# Patient Record
Sex: Female | Born: 1975 | Race: Black or African American | Hispanic: No | Marital: Single | Smoking: Current every day smoker
Health system: Southern US, Community
[De-identification: ages and names within clinical notes are randomized; demographics above are authoritative.]

## PROBLEM LIST (undated history)

## (undated) DIAGNOSIS — I10 Essential (primary) hypertension: Secondary | ICD-10-CM

---

## 2015-03-08 ENCOUNTER — Emergency Department (HOSPITAL_COMMUNITY): Payer: Self-pay

## 2015-03-08 ENCOUNTER — Encounter (HOSPITAL_COMMUNITY): Payer: Self-pay

## 2015-03-08 ENCOUNTER — Emergency Department (HOSPITAL_COMMUNITY)
Admission: EM | Admit: 2015-03-08 | Discharge: 2015-03-08 | Disposition: A | Discharge status: Home / Self Care | Payer: Self-pay | Attending: Emergency Medicine | Admitting: Emergency Medicine

## 2015-03-08 DIAGNOSIS — H0015 Chalazion left lower eyelid: Secondary | ICD-10-CM | POA: Insufficient documentation

## 2015-03-08 DIAGNOSIS — H0016 Chalazion left eye, unspecified eyelid: Secondary | ICD-10-CM

## 2015-03-08 DIAGNOSIS — Z3202 Encounter for pregnancy test, result negative: Secondary | ICD-10-CM | POA: Insufficient documentation

## 2015-03-08 DIAGNOSIS — Z720 Tobacco use: Secondary | ICD-10-CM | POA: Insufficient documentation

## 2015-03-08 LAB — I-STAT CHEM 8, ED
BUN: 10 mg/dL (ref 6–23)
Calcium, Ion: 1.2 mmol/L (ref 1.12–1.23)
Chloride: 102 mmol/L (ref 96–112)
Creatinine, Ser: 0.7 mg/dL (ref 0.50–1.10)
Glucose, Bld: 89 mg/dL (ref 70–99)
HEMATOCRIT: 44 % (ref 36.0–46.0)
Hemoglobin: 15 g/dL (ref 12.0–15.0)
POTASSIUM: 4.1 mmol/L (ref 3.5–5.1)
SODIUM: 138 mmol/L (ref 135–145)
TCO2: 22 mmol/L (ref 0–100)

## 2015-03-08 LAB — POC URINE PREG, ED: PREG TEST UR: NEGATIVE

## 2015-03-08 MED ORDER — HYDROCODONE-ACETAMINOPHEN 5-325 MG PO TABS
2.0000 | ORAL_TABLET | Freq: Once | ORAL | Status: AC
  Administered 2015-03-08: 2 via ORAL
  Filled 2015-03-08: qty 2

## 2015-03-08 MED ORDER — HYDROCODONE-ACETAMINOPHEN 5-325 MG PO TABS: 1.0000 | ORAL_TABLET | Freq: Four times a day (QID) | ORAL | Status: DC | PRN

## 2015-03-08 MED ORDER — CEPHALEXIN 500 MG PO CAPS: 500.0000 mg | ORAL_CAPSULE | Freq: Four times a day (QID) | ORAL | Status: DC

## 2015-03-08 MED ORDER — LIDOCAINE HCL (PF) 1 % IJ SOLN
5.0000 mL | Freq: Once | INTRAMUSCULAR | Status: AC
  Administered 2015-03-08: 5 mL via INTRADERMAL
  Filled 2015-03-08: qty 5

## 2015-03-08 MED ORDER — IOHEXOL 300 MG/ML  SOLN
100.0000 mL | Freq: Once | INTRAMUSCULAR | Status: AC | PRN
  Administered 2015-03-08: 100 mL via INTRAVENOUS

## 2015-03-08 NOTE — ED Notes (Signed)
The tech has applied an clean dressing to the left lower eye lid. The tech has reported to the RN in charge.

## 2015-03-08 NOTE — Discharge Instructions (Signed)
Continue soaking your eyelid with warm water.  Use medications as prescribed.  Follow up with Dr. Cathey EndowBowen on Monday morning.  You should call his office first thing Monday.  Please return to the ER with any worsening of your vision, severe pain, high fever greater than 100.5 F.    Chalazion A chalazion is a swelling or hard lump on the eyelid caused by a blocked oil gland. Chalazions may occur on the upper or the lower eyelid.  CAUSES  Oil gland in the eyelid becomes blocked. SYMPTOMS   Swelling or hard lump on the eyelid. This lump may make it hard to see out of the eye.  The swelling may spread to areas around the eye. TREATMENT   Although some chalazions disappear by themselves in 1 or 2 months, some chalazions may need to be removed.  Medicines to treat an infection may be required. HOME CARE INSTRUCTIONS   Wash your hands often and dry them with a clean towel. Do not touch the chalazion.  Apply heat to the eyelid several times a day for 10 minutes to help ease discomfort and bring any yellowish white fluid (pus) to the surface. One way to apply heat to a chalazion is to use the handle of a metal spoon.  Hold the handle under hot water until it is hot, and then wrap the handle in paper towels so that the heat can come through without burning your skin.  Hold the wrapped handle against the chalazion and reheat the spoon handle as needed.  Apply heat in this fashion for 10 minutes, 4 times per day.  Return to your caregiver to have the pus removed if it does not break (rupture) on its own.  Do not try to remove the pus yourself by squeezing the chalazion or sticking it with a pin or needle.  Only take over-the-counter or prescription medicines for pain, discomfort, or fever as directed by your caregiver. SEEK IMMEDIATE MEDICAL CARE IF:   You have pain in your eye.  Your vision changes.  The chalazion does not go away.  The chalazion becomes painful, red, or swollen, grows  larger, or does not start to disappear after 2 weeks. MAKE SURE YOU:   Understand these instructions.  Will watch your condition.  Will get help right away if you are not doing well or get worse. Document Released: 10/29/2000 Document Revised: 01/24/2012 Document Reviewed: 02/16/2010 [facility] Patient Information 2015 [place], [place]. This information is not intended to replace advice given to you by your health care provider. Make sure you discuss any questions you have with your health care provider.   Emergency Department Resource Guide 1) Find a Doctor and Pay Out of Pocket Although you won't have to find out who is covered by your insurance plan, it is a good idea to ask around and get recommendations. You will then need to call the office and see if the doctor you have chosen will accept you as a new patient and what types of options they offer for patients who are self-pay. Some doctors offer discounts or will set up payment plans for their patients who do not have insurance, but you will need to ask so you aren't surprised when you get to your appointment.  2) Contact Your Local Health Department Not all health departments have doctors that can see patients for sick visits, but many do, so it is worth a call to see if yours does. If you don't know where your local health  department is, you can check in your phone book. The CDC also has a tool to help you locate your state's health department, and many state websites also have listings of all of their local health departments.  3) Find a Walk-in Clinic If your illness is not likely to be very severe or complicated, you may want to try a walk in clinic. These are popping up all over the country in pharmacies, drugstores, and shopping centers. They're usually staffed by nurse practitioners or physician assistants that have been trained to treat common illnesses and complaints. They're usually fairly quick and inexpensive. However, if you have  serious medical issues or chronic medical problems, these are probably not your best option.  No Primary Care Doctor: - Call Health Connect at  [phone] - they can help you locate a primary care doctor that  accepts your insurance, provides certain services, etc. - Physician Referral Service- [phone]  Chronic Pain Problems: Organization         Address  Phone   Notes  [facility]  [phone] Patients need to be referred by their primary care doctor.   Medication Assistance: Organization         Address  Phone   Notes  [facility] Medication [facility] [address] [place] [place], [place] [address] [phone] --Must be a resident of [facility] -- Must have NO insurance coverage whatsoever (no Medicaid/ Medicare, etc.) -- The pt. MUST have a primary care doctor that directs their care regularly and follows them in the community   Med[facility]ist  [phone]   [employer]  [phone]    Agencies that provide inexpensive medical care: Organization         Address  Phone   Notes  [facility]  [phone]   [facility]    [phone]   [facility] [address] [place], [place] [address] [phone]   [facility] [address] [place], [place] [phone]   Planned Parenthood    [phone]   [facility]    [phone]   [facility]  [address], [place] Phone:  [phone], Fax:  [phone] Hours of Operation:  9 am - 6 pm, M-F.  Also accepts Medicaid/Medicare and self-pay.  [facility]  [address], [place] Phone: [phone], Fax: [phone]. Hours of Operation:  8:30 am - 5:30 pm, M-F.  Also accepts Medicaid and self-pay.  [facility] [place] [address], [place] Phone: [phone]   [facility] [address] Natasha Bence [place], [place] [phone], Ext. [phone] Mondays & Thursdays: 7-9 AM.  First 15 patients are seen on a first come, first serve basis.    Medicaid-accepting [facility] Providers:  Organization         Address  Phone   Notes  [facility] [address], [place] [phone] Also accepts self-pay patients.  [facility] [address] Laurell Josephs [place], [place]  [phone]   [facility] [address], [place] [phone]   [facility] [address] [place] [phone]   Renaye Rakers [address], [place]   [phone] Only accepts [place] Access [place] patients after they have  their name applied to their card.   Self-Pay (no insurance) in [facility]:  Organization         Address  Phone   Notes  Sickle Cell Patients, [facility] [address] [place], [place] [phone]   [facility] [address] [place], [place] [phone]   [facility] [place]  [address], [place] [phone]   [facility]/Dr. Osei-Bonsu  [address], [place] or [address], [place] [phone] Phone number for both [place] and [place] locations is the same.  [facility] [address] [place] [phone]   [facility] [address], [place] or [address] [place] [address] [phone]   [facility] [address], [place] [phone], phone; [phone], fax Sees patients 1st and 3rd Saturday of every month.  Must not qualify for public or private insurance (i.e. Medicaid, Medicare, [place] Health Choice, Veterans' Benefits)  Household income should be no more than 200% of the poverty level The clinic cannot treat you if you are pregnant or think you are pregnant   Sexually transmitted diseases are not treated at the clinic.    Dental Care: Organization         Address  Phone  Notes  [facility] Department of [facility] [address] [place], [place] [phone] Accepts children up to age 65 who are enrolled in [place] or [facility] Health Choice; pregnant women with a Medicaid card; and children who have applied for Medicaid or [place] Health Choice, but were declined, whose parents can pay a reduced fee at time of service.  [facility] of [facility]  [address], [place] [phone] Accepts children up to age 74 who are enrolled in [place] or [place] Health Choice; pregnant women with a Medicaid card; and children who have applied for Medicaid or [place] Health Choice, but were declined, whose parents can pay a reduced fee at time of service.  [facility]  [address] [place], [place] [phone] Patients are seen by appointment only. Walk-ins are not accepted. [facility] will see patients 12 years of age and older. Monday - Tuesday (8am-5pm) Most Wednesdays (8:30-5pm) $30 per visit, cash only  [facility] Adult Dental Access [facility]  [address], [facility] [phone] Patients are seen by appointment only. Walk-ins are not accepted. [facility] will see patients 74 years of age and older. One Wednesday Evening (Monthly: Volunteer Based).  $30 per visit, cash only  [employer]  [phone] for adults; Children under age 23, call Graduate Pediatric Dentistry at [phone]. Children aged 26-14, please call [phone] to request a pediatric application.  Dental services are provided in all areas of dental care including fillings, crowns and bridges, complete and partial dentures, implants, gum treatment, root canals, and extractions. Preventive care is also provided. Treatment is provided to both adults and children. Patients  are selected via a lottery and there is often a waiting list.   [facility] [address] [place]  [phone] [email or web address]   [facility] [address] [place], [place] [phone], Ext. [phone] Second and Fourth Thursday of each month, opens at 6:30 AM; Clinic ends at 9 AM.  Patients are seen on  a [employer]	[place] basis, and a limited number are seen during each clinic.   [facility]  [address] Ether Griffins [place], [place] [phone]   Eligibility Requirements You must have lived in [place], [place], or [place] counties for at least the last three months.   You cannot be eligible for state or federal sponsored [place], including [employer], [place], or [employer].   You generally cannot be eligible for healthcare insurance through your employer.    How to apply: Eligibility screenings are held every Tuesday and Wednesday afternoon from 1:00 pm until 4:00 pm. You do not need an appointment for the interview!  [facility] [address], [place], [place] [phone]   [facility] Health Department  [phone]   [facility] Health Department  [phone]   [facility]  [phone]    Behavioral Health Resources in the Community: Intensive Outpatient Programs Organization         Address  Phone  Notes  [facility] [address] [place], [place] [phone]   [facility] [address], [place], [place] [phone]   [facility] [address] [place], [place]  [phone]   [facility] [address],  [place], [place] [phone] or [phone]   Substance Abuse Resources Organization         Address  Phone  Notes  [facility]  [phone]   [facility]  [phone]   [facility] [place]  [phone]   Floydene Flock  [phone]    Residential & Outpatient Substance Abuse Program  [phone]   Psychological Services Organization         Address  Phone  Notes  [facility] Behavioral Health  [phone]   [facility]  [phone]   [facility] [address], [place] [phone] or [phone]    Mobile Crisis Teams Organization         Address  Phone  Notes  [facility], Mobile Crisis Care Unit  [phone]   [facility]  [address] [place], [place] [phone]   Doristine Locks [address] [place] [phone]    Self-Help/Support Groups Organization         Address  Phone             Notes  [facility] [place] - variety of support groups  [phone]- [phone] Call for more information  Narcotics Anonymous (NA), [facility] [address], [employer] [place]  2 meetings at this location   Statistician         Address  Phone  Notes  [facility] [address],    [place]  [phone]   [facility]  [address] [place] [address], [place], [place] [phone]   [facility] [address] [place], [place] [phone] Admissions: 8am-3pm M-F  [facility] [address],    [place], [place] [phone]   [facility] [address] Starling Manns [place], [place] [phone]   [facility] [address]  [place], [place] [phone]   [facility] - Intensive Outpatient [address] Laurell Josephs [address], [place], [place] [phone]   [facility] ([facility] [address] [place]  [place], [place] [phone] or [phone]   [facility] ([facility]) [address], [place], [place] [phone]  Accepts Medicaid  [facility] [place] [address],  [place] [phone] Substance Abuse/Addiction Treatment   [facility] Organization         Address  Phone  Notes  [facility]  [phone]   Angie Fava, PhD [address] Ervin Knack [place], [place]   [phone] or [phone]   [facility] Behavioral   [address] [place], [place] [phone]   [facility] [address], [place], [place] [phone] Insurance/Medicaid/sponsorship through [facility] and Families [address]                                    [place], [place] [phone] Therapy/tele-psych/case  [facility] [address], [place] [phone]    Dr. Lolly Mustache  [phone]   [facility] [place]  [employer] [facility] Dept. 1) [address] [place] 2) [address], [place] 3)  [address], [place] [phone]  [phone]   [facility] Child Abuse Hotline [phone] or [phone] (After Hours)

## 2015-03-08 NOTE — ED Provider Notes (Signed)
Care assumed from Ladona MowJoe Mintz PA-C at shift change. Pt awaiting CT of orbits to eval for extension of infected chalazion.    Physical Exam  BP 134/77 mmHg  Pulse 65  Temp(Src) 98.2 F (36.8 C) (Oral)  Resp 16  Ht 5\' 3"  (1.6 m)  Wt 167 lb 1 oz (75.779 kg)  BMI 29.60 kg/m2  SpO2 100%  LMP 01/24/2015  Physical Exam HEENT: L lower lid with chalazion, erythematous and swollen, tender, EOMI without pain  ED Course  Procedures Results for orders placed or performed during the hospital encounter of 03/08/15  POC Urine Pregnancy, ED (do NOT [facility] at [facility])  Result Value Ref Range   Preg Test, Ur NEGATIVE NEGATIVE  I-stat chem 8, ed  Result Value Ref Range   Sodium 138 135 - 145 mmol/L   Potassium 4.1 3.5 - 5.1 mmol/L   Chloride 102 96 - 112 mmol/L   BUN 10 6 - 23 mg/dL   Creatinine, Ser 1.470.70 0.50 - 1.10 mg/dL   Glucose, Bld 89 70 - 99 mg/dL   Calcium, Ion 8.291.20 1.12 - 1.23 mmol/L   TCO2 22 0 - 100 mmol/L   Hemoglobin 15.0 12.0 - 15.0 g/dL   HCT 56.244.0 13.036.0 - 86.546.0 %   Ct Orbits W/cm  03/08/2015   CLINICAL DATA:  Periorbital lesion.  Infected wound.  Swelling.  EXAM: CT ORBITS WITH CONTRAST  TECHNIQUE: Multidetector CT imaging of the orbits was performed following the bolus administration of intravenous contrast.  CONTRAST:  100mL OMNIPAQUE IOHEXOL 300 MG/ML  SOLN  COMPARISON:  None.  FINDINGS: Infraorbital phlegmon is present on the LEFT, with enhancement of the soft tissues of the superior LEFT cheek. There is a subcutaneous abscess with peripheral enhancement measuring 15 mm x 9 mm on axial imaging. 9 mm is the AP dimension. Craniocaudal dimension is 11.4 mm on sagittal imaging.  There is no orbital abscess or orbital phlegmon. The RIGHT globe and orbit appear normal. Intracranial contents are within normal limits.  Paranasal sinuses appear within normal limits.  IMPRESSION: LEFT infraorbital subcutaneous abscess.   Electronically Signed   By: Andreas NewportGeoffrey  Lamke M.D.   On: 03/08/2015 18:29      Meds ordered this encounter  Medications  . lidocaine (PF) (XYLOCAINE) 1 % injection 5 mL    Sig:   . HYDROcodone-acetaminophen (NORCO/VICODIN) 5-325 MG per tablet 2 tablet    Sig:   . ibuprofen (ADVIL,MOTRIN) 200 MG tablet    Sig: Take 200 mg by mouth every 6 (six) hours as needed for mild pain or moderate pain.  Marland Kitchen. OVER THE COUNTER MEDICATION    Sig: Place 1 drop into both eyes daily as needed (eye irritattion).  . iohexol (OMNIPAQUE) 300 MG/ML solution 100 mL    Sig:   . HYDROcodone-acetaminophen (NORCO/VICODIN) 5-325 MG per tablet    Sig: Take 1-2 tablets by mouth every 6 (six) hours as needed.    Dispense:  10 tablet    Refill:  0    Order Specific Question:  Supervising Provider    Answer:  Hyacinth MeekerMILLER, BRIAN [[id number]]  . cephALEXin (KEFLEX) 500 MG capsule    Sig: Take 1 capsule (500 mg total) by mouth 4 (four) times daily.    Dispense:  40 capsule    Refill:  0    Order Specific Question:  Supervising Provider    Answer:  Eber HongMILLER, BRIAN [[id number]]     MDM:   ICD-9-CM ICD-10-CM   1. Chalazion of left eye 373.2  H00.16     6:44 PM CT neg for orbital extension. Pt given dispo instructions per Ladona Mow PA-C   Chee Dimon Camprubi-Soms, PA-C 03/08/15 1844  Gilda Crease, MD 03/09/15 [phone]

## 2015-03-08 NOTE — ED Provider Notes (Signed)
Patient presented to the ER with swelling of left eyelid. Initially started as a small area that she thought was a stye, now the entire left eyelid is swollen and painful  Face to face Exam: HEENT - PERRLA; significant swelling, tenderness and fluctuance of left lower eyelid with some purulent drainage spontaneously draining Lungs - CTAB Heart - RRR, no M/R/G Abd - S/NT/ND Neuro - alert, oriented x3  Plan: Patient with eyelid inflammation and infection. Is already is spontaneously draining, will slightly open the area further and cover with antibiotics, follow-up with ophthalmologist  Gilda Creasehristopher J Keo Schirmer, MD 03/08/15 1430

## 2015-03-08 NOTE — ED Notes (Signed)
Pt had what she thought was a sty since this past Tuesday and since it has gotten bigger and worse.

## 2015-03-08 NOTE — ED Provider Notes (Signed)
CSN: [id number]     Arrival date & time 03/08/15  1316 History   This chart was scribed for non-physician practitioner, Jinny Sanders, PA-C working with Gilda Crease, MD, by Abel Presto, ED Scribe. This patient was seen in room TR04C/TR04C and the patient's care was started at 1:44 PM.       Chief Complaint  Patient presents with  . Eye Problem    Patient is a 39 y.o. female presenting with eye problem. The history is provided by the patient. No language interpreter was used.  Eye Problem  HPI Comments: Mary Oneill is a 39 y.o. female who presents to the Emergency Department complaining of lesion to lower left eyelid with onset 4 days ago. Pt states lesion was similar to a stye at onset but she reports she has been "picking" at the area which has become larger with associated swelling since onset. Noted scabbing and mild bleeding to the area. Pt with h/o of styes. Pt denies fever, eye pain, and visual disturbances.    History reviewed. No pertinent past medical history. History reviewed. No pertinent past surgical history. No family history on file. History  Substance Use Topics  . Smoking status: Current Every Day Smoker  . Smokeless tobacco: Not on file  . Alcohol Use: Yes     Comment: socially   OB History    No data available     Review of Systems  Constitutional: Negative for fever.  Eyes: Negative for pain.       Lesion to lower left eyelid      Allergies  Review of patient's allergies indicates no known allergies.  Home Medications   Prior to Admission medications   Medication Sig Start Date End Date Taking? Authorizing Provider  ibuprofen (ADVIL,MOTRIN) 200 MG tablet Take 200 mg by mouth every 6 (six) hours as needed for mild pain or moderate pain.   Yes Historical Provider, MD  OVER THE COUNTER MEDICATION Place 1 drop into both eyes daily as needed (eye irritattion).   Yes Historical Provider, MD  cephALEXin (KEFLEX) 500 MG capsule Take 1 capsule (500  mg total) by mouth 4 (four) times daily. 03/08/15   Ladona Mow, PA-C  HYDROcodone-acetaminophen (NORCO/VICODIN) 5-325 MG per tablet Take 1-2 tablets by mouth every 6 (six) hours as needed. 03/08/15   Ladona Mow, PA-C   BP 125/73 mmHg  Pulse 66  Temp(Src) 98.1 F (36.7 C) (Oral)  Resp 16  Ht  (1.6 m)  Wt 167 lb 1 oz (75.779 kg)  BMI 29.60 kg/m2  SpO2 100%  LMP 01/24/2015 Physical Exam  Constitutional: She is oriented to person, place, and time. She appears well-developed and well-nourished.  HENT:  Head: Normocephalic.  1x 2 cm cystic lesion on lower left eyelid in the medial epicanthal fold  Eyes: Conjunctivae are normal.  Neck: Normal range of motion. Neck supple.  Pulmonary/Chest: Effort normal.  Musculoskeletal: Normal range of motion.  Neurological: She is alert and oriented to person, place, and time.  Skin: Skin is warm and dry.  Psychiatric: She has a normal mood and affect. Her behavior is normal.  Nursing note and vitals reviewed.   ED Course  Procedures (including critical care time) DIAGNOSTIC STUDIES: Oxygen Saturation is 100% on room air, normal by my interpretation.    COORDINATION OF CARE: 1:50 PM Discussed treatment plan with patient at beside, the patient agrees with the plan and has no further questions at this time.   Labs Review Labs Reviewed  POC URINE PREG, ED  I-STAT CHEM 8, ED    Imaging Review Ct Orbits W/cm  03/08/2015   CLINICAL DATA:  Periorbital lesion.  Infected wound.  Swelling.  EXAM: CT ORBITS WITH CONTRAST  TECHNIQUE: Multidetector CT imaging of the orbits was performed following the bolus administration of intravenous contrast.  CONTRAST:  100mL OMNIPAQUE IOHEXOL 300 MG/ML  SOLN  COMPARISON:  None.  FINDINGS: Infraorbital phlegmon is present on the LEFT, with enhancement of the soft tissues of the superior LEFT cheek. There is a subcutaneous abscess with peripheral enhancement measuring 15 mm x 9 mm on axial imaging. 9 mm is the AP  dimension. Craniocaudal dimension is 11.4 mm on sagittal imaging.  There is no orbital abscess or orbital phlegmon. The RIGHT globe and orbit appear normal. Intracranial contents are within normal limits.  Paranasal sinuses appear within normal limits.  IMPRESSION: LEFT infraorbital subcutaneous abscess.   Electronically Signed   By: Andreas NewportGeoffrey  Lamke M.D.   On: 03/08/2015 18:29     EKG Interpretation None     INCISION AND DRAINAGE PROCEDURE NOTE: Patient identification was confirmed and verbal consent was obtained. This procedure was performed by Jinny SandersJoseph Pape Parson, PA-C at 2:30 PM. Site: left lower eyelid Sterile procedures observed Needle size: 25 guage Anesthetic used (type and amt): lido 1% Blade size: 11 blade Drainage: scant purulent drainage Complexity: Complex Packing used: none Site anesthetized, incision made over site, wound drained and explored loculations, rinsed with copious amounts of normal saline, covered with dry, sterile dressing.  Pt tolerated procedure well without complications.  Instructions for care discussed verbally and pt provided with additional written instructions for homecare and f/u.  MDM   Final diagnoses:  Chalazion of left eye   Patient here with signs and symptoms consistent with a large Chalazion in her lower left eyelid. This cystic lesion appeared infected, had some purulent discharge from it, incision and drainage was warranted as noted in procedure note above. Visual acuity screening shows change in visual acuity, however patient states this is due to the fact that her eyelid is so swollen, she denies any actual visual acuity change from her baseline. I spoke with Dr. Doylene CanningBolin regarding patient's case, Dr. Orson SlickBowman recommended follow-up with CT scan of orbits with contrast, continue warm compresses of eye, Keflex 10 days and follow-up with him in his office on Monday. We'll place order for CT scan. [facility] with plan to  f/u on CT results and discharge if there is no orbital involvement.  Plan to call Dr. Cathey EndowBowen back if involvement is more than superficial.  Pt is to be discharged to f/u with Dr. Cathey EndowBowen on Monday. Encouraged warm soaks and return precautions with pt.   I personally performed the services described in this documentation, which was scribed in my presence. The recorded information has been reviewed and is accurate.   BP 125/73 mmHg  Pulse 66  Temp(Src) 98.1 F (36.7 C) (Oral)  Resp 16  Ht 5\' 3"  (1.6 m)  Wt 167 lb 1 oz (75.779 kg)  BMI 29.60 kg/m2  SpO2 100%  LMP 01/24/2015  Signed,  Ladona MowJoe Oneill Bais, PA-C 7:08 AM     Ladona MowJoe Lindzie Boxx, PA-C 03/09/15 [phone]  Gilda Creasehristopher J Pollina, MD 03/09/15 [phone]

## 2015-03-08 NOTE — ED Notes (Signed)
Spoke with CT states she is the next one on  The list.

## 2015-08-17 ENCOUNTER — Emergency Department (INDEPENDENT_AMBULATORY_CARE_PROVIDER_SITE_OTHER)
Admission: EM | Admit: 2015-08-17 | Discharge: 2015-08-17 | Disposition: A | Discharge status: Home / Self Care | Payer: Self-pay | Source: Home / Self Care | Attending: Emergency Medicine | Admitting: Emergency Medicine

## 2015-08-17 ENCOUNTER — Encounter (HOSPITAL_COMMUNITY): Payer: Self-pay | Admitting: Emergency Medicine

## 2015-08-17 ENCOUNTER — Emergency Department (INDEPENDENT_AMBULATORY_CARE_PROVIDER_SITE_OTHER): Payer: Self-pay

## 2015-08-17 DIAGNOSIS — S93401A Sprain of unspecified ligament of right ankle, initial encounter: Secondary | ICD-10-CM

## 2015-08-17 MED ORDER — TRAMADOL HCL 50 MG PO TABS: 50.0000 mg | ORAL_TABLET | Freq: Four times a day (QID) | ORAL | Status: DC | PRN

## 2015-08-17 MED ORDER — IBUPROFEN 800 MG PO TABS: 800.0000 mg | ORAL_TABLET | Freq: Three times a day (TID) | ORAL | Status: DC | PRN

## 2015-08-17 NOTE — Discharge Instructions (Signed)
You have a bad ankle sprain. Wear the brace for the next 1-2 weeks. Use the crutches for the next few days. Starting on Tuesday start to slowly add more and more weight to your ankle. You should be able to get rid of the crutches in 5-7 days. Keep her foot elevated as much as possible. Apply ice as often as possible. Take ibuprofen  3 times a day for the next 3 days, then as needed. Use tramadol every 6-8 hours as needed for severe pain. Do not drive while taking this medicine. It will take 2-3 weeks for your ankle to completely heal.

## 2015-08-17 NOTE — ED Provider Notes (Signed)
CSN: [id number]     Arrival date & time 08/17/15  1339 History   First MD Initiated Contact with Patient 08/17/15 1441     Chief Complaint  Patient presents with  . Foot Injury   (Consider location/radiation/quality/duration/timing/severity/associated sxs/prior Treatment) HPI She is a 39 year old woman here for evaluation of right foot injury. She states she tripped over a stump last night. She does not remember exactly how she hit her foot. She reports pain and swelling primarily on the medial aspect of the ankle. She cannot bear weight. She has not tried any medications.  History reviewed. No pertinent past medical history. History reviewed. No pertinent past surgical history. No family history on file. Social History  Substance Use Topics  . Smoking status: Current Every Day Smoker  . Smokeless tobacco: None  . Alcohol Use: Yes     Comment: socially   OB History    No data available     Review of Systems As in history of present illness Allergies  Review of patient's allergies indicates no known allergies.  Home Medications   Prior to Admission medications   Medication Sig Start Date End Date Taking? Authorizing Provider  HYDROcodone-acetaminophen (NORCO/VICODIN) 5-325 MG per tablet Take 1-2 tablets by mouth every 6 (six) hours as needed. 03/08/15   Ladona Mow, PA-C  ibuprofen (ADVIL,MOTRIN) 800 MG tablet Take 1 tablet (800 mg total) by mouth every 8 (eight) hours as needed for moderate pain. 08/17/15   Charm Rings, MD  OVER THE COUNTER MEDICATION Place 1 drop into both eyes daily as needed (eye irritattion).    Historical Provider, MD  traMADol (ULTRAM) 50 MG tablet Take 1 tablet (50 mg total) by mouth every 6 (six) hours as needed. 08/17/15   Charm Rings, MD   Meds Ordered and Administered this Visit  Medications - No data to display  BP 161/88 mmHg  Pulse 83  Temp(Src) 98.4 F (36.9 C) (Oral)  Resp 18  SpO2 100% No data found.   Physical Exam  Constitutional:  She is oriented to person, place, and time. She appears well-developed and well-nourished. No distress.  Cardiovascular: Normal rate.   Pulmonary/Chest: Effort normal.  Musculoskeletal:  Right ankle: 2+ DP pulse. She has swelling anterior to the medial malleolus. She is tender over the deltoid ligament. Mild tenderness to posterior medial malleolus. No fifth metatarsal tenderness. No joint laxity.  Neurological: She is alert and oriented to person, place, and time.    ED Course  Procedures (including critical care time)  Labs Review Labs Reviewed - No data to display  Imaging Review Dg Ankle Complete Right  08/17/2015   CLINICAL DATA:  Pain following fall one day prior  EXAM: RIGHT ANKLE - COMPLETE 3+ VIEW  COMPARISON:  None.  FINDINGS: Frontal, oblique, and lateral views were obtained. There is no demonstrable fracture or effusion. The ankle mortise appears intact. No appreciable joint space narrowing.  IMPRESSION: No fracture or arthropathy.  Ankle mortise appears intact.   Electronically Signed   By: Bretta Bang III M.D.   On: 08/17/2015 15:08   Dg Foot Complete Right  08/17/2015   CLINICAL DATA:  initial encounter fall yesterday medial right ankle and lateral right foot pain  EXAM: RIGHT FOOT COMPLETE - 3+ VIEW  COMPARISON:  None.  FINDINGS: Mild arthritis of the first metatarsophalangeal joint. No fracture or dislocation.  IMPRESSION: No acute findings   Electronically Signed   By: Esperanza Heir M.D.   On: 08/17/2015 15:09  MDM   1. Right ankle sprain, initial encounter    X-rays negative. Treat with rest, ice, bracing, ibuprofen. Crutches given.  ASO given. Tramadol for severe pain. Follow-up as needed.    Charm Rings, MD 08/17/15 [phone]

## 2015-08-17 NOTE — ED Notes (Signed)
C/o right foot inj onset last; reports she hit it against tree stump  Brought back in wheel chair A&O x4... No acute distress.

## 2016-03-24 ENCOUNTER — Encounter (HOSPITAL_COMMUNITY): Payer: Self-pay | Admitting: *Deleted

## 2016-03-24 ENCOUNTER — Emergency Department (HOSPITAL_COMMUNITY)
Admission: EM | Admit: 2016-03-24 | Discharge: 2016-03-24 | Disposition: A | Discharge status: Home / Self Care | Payer: Self-pay | Attending: Emergency Medicine | Admitting: Emergency Medicine

## 2016-03-24 DIAGNOSIS — I10 Essential (primary) hypertension: Secondary | ICD-10-CM | POA: Insufficient documentation

## 2016-03-24 DIAGNOSIS — F17200 Nicotine dependence, unspecified, uncomplicated: Secondary | ICD-10-CM | POA: Insufficient documentation

## 2016-03-24 MED ORDER — HYDROCHLOROTHIAZIDE 12.5 MG PO TABS: 12.5000 mg | ORAL_TABLET | Freq: Every day | ORAL | Status: DC

## 2016-03-24 NOTE — ED Notes (Signed)
[facility] and her BP was 162/100.  Pt was told to come to the ED.  Pt has mild HA.  No hx of HTN

## 2016-03-24 NOTE — Discharge Instructions (Signed)
I will start you on a low dose blood pressure medicine which you may begin taking if your blood pressure readings remain elevated above 140/90 at home. Take medications as prescribed. Return to the emergency room for worsening condition or new concerning symptoms. Follow up with your regular doctor. If you don't have a regular doctor use one of the numbers below to establish a primary care doctor.   Emergency Department Resource Guide 1) Find a Doctor and Pay Out of Pocket Although you won't have to find out who is covered by your insurance plan, it is a good idea to ask around and get recommendations. You will then need to call the office and see if the doctor you have chosen will accept you as a new patient and what types of options they offer for patients who are self-pay. Some doctors offer discounts or will set up payment plans for their patients who do not have insurance, but you will need to ask so you aren't surprised when you get to your appointment.  2) Contact Your Local Health Department Not all health departments have doctors that can see patients for sick visits, but many do, so it is worth a call to see if yours does. If you don't know where your local health department is, you can check in your phone book. The CDC also has a tool to help you locate your state's health department, and many state websites also have listings of all of their local health departments.  3) Find a Walk-in Clinic If your illness is not likely to be very severe or complicated, you may want to try a walk in clinic. These are popping up all over the country in pharmacies, drugstores, and shopping centers. They're usually staffed by nurse practitioners or physician assistants that have been trained to treat common illnesses and complaints. They're usually fairly quick and inexpensive. However, if you have serious medical issues or chronic medical problems, these are probably not your best option.  No Primary Care  Doctor: - Call [facility] at  [phone] - they can help you locate a primary care doctor that  accepts your insurance, provides certain services, etc. - Physician Referral [phone]  Emergency Department Resource Guide 1) Find a Doctor and Pay Out of Pocket Although you won't have to find out who is covered by your insurance plan, it is a good idea to ask around and get recommendations. You will then need to call the office and see if the doctor you have chosen will accept you as a new patient and what types of options they offer for patients who are self-pay. Some doctors offer discounts or will set up payment plans for their patients who do not have insurance, but you will need to ask so you aren't surprised when you get to your appointment.  2) Contact Your Local Health Department Not all health departments have doctors that can see patients for sick visits, but many do, so it is worth a call to see if yours does. If you don't know where your local health department is, you can check in your phone book. The CDC also has a tool to help you locate your state's health department, and many state websites also have listings of all of their local health departments.  3) Find a Walk-in Clinic If your illness is not likely to be very severe or complicated, you may want to try a walk in clinic. These are popping up all over the country in  pharmacies, drugstores, and shopping centers. They're usually staffed by nurse practitioners or physician assistants that have been trained to treat common illnesses and complaints. They're usually fairly quick and inexpensive. However, if you have serious medical issues or chronic medical problems, these are probably not your best option. ° °No Primary Care Doctor: °- Call [facility] Connect at  [phone] - they can help you locate a primary care doctor that  accepts your insurance, provides certain services, etc. °- Physician Referral Service-  1-800-533-3463 ° °Chronic Pain Problems: °Organization         Address  Phone   Notes  °[facility]  ([phone] Patients need to be referred by their primary care doctor.  ° °Medication Assistance: °Organization         Address  Phone   Notes  °[place] [facility] [address] °[place], [place] [address] [phone]) [phone] --Must be a resident of [place] °-- Must have NO insurance coverage whatsoever (no Medicaid/ Medicare, etc.) °-- The pt. MUST have a primary care doctor that directs their care regularly and follows them in the community °  °[facility]  (866) 331-1348   °[employer]  (888) 892-1162   ° °Agencies that provide inexpensive medical care: °Organization         Address  Phone   Notes  °[facility]  ([phone]) [phone]   °[facility]    ([phone]) [phone]   °[facility] [address] °[place], [place] [address] [phone]) [phone]   °[facility] [address], °[place] ([phone]) [phone]   °[facility]    (336) 373-0678   °[facility]    ([phone]) 272-1050   °[facility] ° [address], [place] Phone:  ([phone]) [phone], Fax:  ([phone] Hours of Operation:  9 am - 6 pm, M-F.  Also accepts Medicaid/Medicare and self-pay.  °[facility] ° [address], [place] Phone: ([phone]) [phone], Fax: ([phone]) [phone]. Hours of Operation:  8:30 am - 5:30 pm, M-F.  Also accepts Medicaid and self-pay.  °[facility] [place] [address], [place] Phone: ([phone]) [phone]   °[facility] [address], [place], [place] (336)[phone], Ext. 123 Mondays & Thursdays: 7-9 AM.  First 15 patients are seen on a first come, first serve basis. °  ° °Medicaid-accepting [place] Providers: ° °Organization         Address  Phone   Notes  °[facility] [address],  [place] ([phone]) [phone] Also accepts self-pay patients.  °[facility] [address], [place] ° ([phone]) [phone]   °[facility] [address], [place] ([phone]) [phone]   °[facility] [address] [place] [address], [place] ([phone]) [phone]   °[facility] [address], [place]  ° ([phone]) [phone] Only accepts [place] Access Medicaid patients after they have their name applied to their card.  ° °Self-Pay (no insurance) in [place]: ° °Organization         Address  Phone   Notes  °Sickle Cell Patients, [facility] [address], [place] ([phone]) [phone]   °[facility] [address],  ([phone]) [phone]   °[facility] ° [address],   [place] [phone](336) [phone]   [facility]/Dr. Osei-Bonsu  [address] [place] [address], [place] or [address], [place] [phone] Phone number for both [place] and [place] locations is the same.  [facility] [address], [place] [phone]   [facility] [address] [place] [address], [place] or [address] [phone]   [facility] [address], [place] [phone], phone; [phone], fax Sees patients 1st and 3rd Saturday of every month.  Must not qualify for public or private insurance (i.e. Medicaid, Medicare, [place] Health Choice, Veterans' Benefits)  Household income should be no more than 200% of the poverty level The clinic cannot treat you if you are pregnant or think you are pregnant  Sexually transmitted diseases are not treated at the clinic.     Hypertension Hypertension, commonly called high blood pressure, is when the force of blood pumping through your arteries is too strong. Your arteries are the blood vessels that carry blood from your heart throughout your body. A  blood pressure reading consists of a higher number over a lower number, such as 110/72. The higher number (systolic) is the pressure inside your arteries when your heart pumps. The lower number (diastolic) is the pressure inside your arteries when your heart relaxes. Ideally you want your blood pressure below 120/80. Hypertension forces your heart to work harder to pump blood. Your arteries may become narrow or stiff. Having untreated or uncontrolled hypertension can cause heart attack, stroke, kidney disease, and other problems. RISK FACTORS Some risk factors for high blood pressure are controllable. Others are not.  Risk factors you cannot control include:   Race. You may be at higher risk if you are African American.  Age. Risk increases with age.  Gender. Men are at higher risk than women before age 40 years. After age 40, women are at higher risk than men. Risk factors you can control include:  Not getting enough exercise or physical activity.  Being overweight.  Getting too much fat, sugar, calories, or salt in your diet.  Drinking too much alcohol. SIGNS AND SYMPTOMS Hypertension does not usually cause signs or symptoms. Extremely high blood pressure (hypertensive crisis) may cause headache, anxiety, shortness of breath, and nosebleed. DIAGNOSIS To check if you have hypertension, your health care provider will measure your blood pressure while you are seated, with your arm held at the level of your heart. It should be measured at least twice using the same arm. Certain conditions can cause a difference in blood pressure between your right and left arms. A blood pressure reading that is higher than normal on one occasion does not mean that you need treatment. If it is not clear whether you have high blood pressure, you may be asked to return on a different day to have your blood pressure checked again. Or, you may be asked to monitor your blood pressure at home for 1 or more  weeks. TREATMENT Treating high blood pressure includes making lifestyle changes and possibly taking medicine. Living a healthy lifestyle can help lower high blood pressure. You may need to change some of your habits. Lifestyle changes may include:  Following the DASH diet. This diet is high in fruits, vegetables, and whole grains. It is low in salt, red meat, and added sugars.  Keep your sodium intake below 2,300 mg per day.  Getting at least 30-45 minutes of aerobic exercise at least 4 times  per week.  Losing weight if necessary.  Not smoking.  Limiting alcoholic beverages.  Learning ways to reduce stress. Your health care provider may prescribe medicine if lifestyle changes are not enough to get your blood pressure under control, and if one of the following is true:  You are 44-87 years of age and your systolic blood pressure is above 140.  You are 44 years of age or older, and your systolic blood pressure is above 150.  Your diastolic blood pressure is above 90.  You have diabetes, and your systolic blood pressure is over 140 or your diastolic blood pressure is over 90.  You have kidney disease and your blood pressure is above 140/90.  You have heart disease and your blood pressure is above 140/90. Your personal target blood pressure may vary depending on your medical conditions, your age, and other factors. HOME CARE INSTRUCTIONS  Have your blood pressure rechecked as directed by your health care provider.   Take medicines only as directed by your health care provider. Follow the directions carefully. Blood pressure medicines must be taken as prescribed. The medicine does not work as well when you skip doses. Skipping doses also puts you at risk for problems.  Do not smoke.   Monitor your blood pressure at home as directed by your health care provider. SEEK MEDICAL CARE IF:   You think you are having a reaction to medicines taken.  You have recurrent headaches or  feel dizzy.  You have swelling in your ankles.  You have trouble with your vision. SEEK IMMEDIATE MEDICAL CARE IF:  You develop a severe headache or confusion.  You have unusual weakness, numbness, or feel faint.  You have severe chest or abdominal pain.  You vomit repeatedly.  You have trouble breathing. MAKE SURE YOU:   Understand these instructions.  Will watch your condition.  Will get help right away if you are not doing well or get worse.   This information is not intended to replace advice given to you by your health care provider. Make sure you discuss any questions you have with your health care provider.   Document Released: 11/01/2005 Document Revised: 03/18/2015 Document Reviewed: 08/24/2013 Elsevier Interactive Patient Education [employer]

## 2016-03-24 NOTE — ED Provider Notes (Signed)
CSN: [id number]     Arrival date & time 03/24/16  2052 History   First MD Initiated Contact with Patient 03/24/16 2120     Chief Complaint  Patient presents with  . Hypertension     HPI   Mary Oneill is an [age] y.o. female who presents to the ED for evaluation of elevated BP. She states she was at the health department today for routine physical and depo shot when they told her that her BP was elevated. Pt states her BP was 162/100 "and the nurse told me I had to come to the ER because I could die." She states that typically her BP has been normal at previous doctor's visits. She states that she had bloodwork done today as well which was normal. She denies any symptoms at this time. Denies headache, blurred vision, chest pain, SOB, urinary symptoms. She does endorse smoking 1 PPD and is not ready to quit.   History reviewed. No pertinent past medical history. History reviewed. No pertinent past surgical history. No family history on file. Social History  Substance Use Topics  . Smoking status: Current Every Day Smoker  . Smokeless tobacco: None  . Alcohol Use: Yes     Comment: socially   OB History    No data available     Review of Systems  All other systems reviewed and are negative.     Allergies  Review of patient's allergies indicates no known allergies.  Home Medications   Prior to Admission medications   Medication Sig Start Date End Date Taking? Authorizing Provider  HYDROcodone-acetaminophen (NORCO/VICODIN) 5-325 MG per tablet Take 1-2 tablets by mouth every 6 (six) hours as needed. 03/08/15   Ladona MowJoe Mintz, PA-C  ibuprofen (ADVIL,MOTRIN) 800 MG tablet Take 1 tablet (800 mg total) by mouth every 8 (eight) hours as needed for moderate pain. 08/17/15   Charm RingsErin J Honig, MD  OVER THE COUNTER MEDICATION Place 1 drop into both eyes daily as needed (eye irritattion).    Historical Provider, MD  traMADol (ULTRAM) 50 MG tablet Take 1 tablet (50 mg total) by mouth every 6 (six) hours as  needed. 08/17/15   Charm RingsErin J Honig, MD   BP 159/94 mmHg  Pulse 63  Temp(Src) 98 F (36.7 C) (Oral)  Resp 18  Wt 69.4 kg  SpO2 99% Physical Exam  Constitutional: She is oriented to person, place, and time. No distress.  HENT:  Head: Atraumatic.  Right Ear: External ear normal.  Left Ear: External ear normal.  Nose: Nose normal.  Eyes: Conjunctivae are normal. No scleral icterus.  Neck: Normal range of motion. Neck supple.  Cardiovascular: Normal rate and regular rhythm.   Pulmonary/Chest: Effort normal. No respiratory distress. She exhibits no tenderness.  Abdominal: Soft. She exhibits no distension. There is no tenderness.  Neurological: She is alert and oriented to person, place, and time.  Skin: Skin is warm and dry. She is not diaphoretic.  Psychiatric: She has a normal mood and affect. Her behavior is normal.  Nursing note and vitals reviewed.  Filed Vitals:   03/24/16 2111 03/24/16 2113  BP: 166/95 159/94  Pulse: 63   Temp: 98 F (36.7 C)   TempSrc: Oral   Resp: 18   Weight: 69.4 kg   SpO2: 99%      ED Course  Procedures (including critical care time) Labs Review Labs Reviewed - No data to display  Imaging Review No results found. I have personally reviewed and evaluated these images and  lab results as part of my medical decision-making.   EKG Interpretation None      MDM   Final diagnoses:  Essential hypertension    BP only mildly elevated in the ED today and patient is asymptomatic. Will hold off on labs today. Pt states she had bloodwork done recently as well and states it was normal. Pt's husband has a BP monitor at home. Instructed pt to monitor BP at home and if remains persistently elevated >140/90 will start on low dose HCTZ. Pt does not have a PCP. In the meantime will give resource guide to establish PCP for follow up. ER return precautions given.     Carlene Coria, PA-C 03/24/16 2133  Pricilla Loveless, MD 03/25/16 [phone]

## 2016-03-24 NOTE — ED Notes (Signed)
Spoke with PA regarding pt being seen in [facility].

## 2016-06-21 ENCOUNTER — Encounter (HOSPITAL_COMMUNITY): Payer: Self-pay | Admitting: Vascular Surgery

## 2016-06-21 ENCOUNTER — Emergency Department (HOSPITAL_COMMUNITY)
Admission: EM | Admit: 2016-06-21 | Discharge: 2016-06-21 | Disposition: A | Discharge status: Home / Self Care | Payer: Self-pay | Attending: Emergency Medicine | Admitting: Emergency Medicine

## 2016-06-21 ENCOUNTER — Emergency Department (HOSPITAL_COMMUNITY): Payer: Self-pay

## 2016-06-21 DIAGNOSIS — Z79899 Other long term (current) drug therapy: Secondary | ICD-10-CM | POA: Insufficient documentation

## 2016-06-21 DIAGNOSIS — Y939 Activity, unspecified: Secondary | ICD-10-CM | POA: Insufficient documentation

## 2016-06-21 DIAGNOSIS — F17200 Nicotine dependence, unspecified, uncomplicated: Secondary | ICD-10-CM | POA: Insufficient documentation

## 2016-06-21 DIAGNOSIS — Y999 Unspecified external cause status: Secondary | ICD-10-CM | POA: Insufficient documentation

## 2016-06-21 DIAGNOSIS — Y9289 Other specified places as the place of occurrence of the external cause: Secondary | ICD-10-CM | POA: Insufficient documentation

## 2016-06-21 DIAGNOSIS — M25511 Pain in right shoulder: Secondary | ICD-10-CM | POA: Insufficient documentation

## 2016-06-21 MED ORDER — NAPROXEN 375 MG PO TABS: 375.0000 mg | ORAL_TABLET | Freq: Two times a day (BID) | ORAL | 0 refills | Status: DC

## 2016-06-21 NOTE — ED Notes (Signed)
Dinner and bus pass given along w/ work excuse.
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
## 2017-02-04 ENCOUNTER — Encounter (HOSPITAL_COMMUNITY): Payer: Self-pay | Admitting: Emergency Medicine

## 2017-02-04 ENCOUNTER — Emergency Department (HOSPITAL_COMMUNITY)
Admission: EM | Admit: 2017-02-04 | Discharge: 2017-02-04 | Disposition: A | Discharge status: Home / Self Care | Payer: Self-pay | Attending: Emergency Medicine | Admitting: Emergency Medicine

## 2017-02-04 DIAGNOSIS — L237 Allergic contact dermatitis due to plants, except food: Secondary | ICD-10-CM | POA: Insufficient documentation

## 2017-02-04 DIAGNOSIS — F17200 Nicotine dependence, unspecified, uncomplicated: Secondary | ICD-10-CM | POA: Insufficient documentation

## 2017-02-04 DIAGNOSIS — I10 Essential (primary) hypertension: Secondary | ICD-10-CM | POA: Insufficient documentation

## 2017-02-04 HISTORY — DX: Essential (primary) hypertension: I10

## 2017-02-04 MED ORDER — PREDNISONE 20 MG PO TABS: 60.0000 mg | ORAL_TABLET | Freq: Every day | ORAL | 0 refills | Status: DC

## 2017-02-04 MED ORDER — PREDNISONE 20 MG PO TABS
60.0000 mg | ORAL_TABLET | Freq: Once | ORAL | Status: AC
  Administered 2017-02-04: 60 mg via ORAL
  Filled 2017-02-04: qty 3

## 2017-02-04 NOTE — ED Triage Notes (Signed)
Pt brought in by EMS with c/o rash to the right side of her face and her neck  Pt states she thinks she is having an allergic reaction to grapes  Pt states the rash started on Monday after eating a bowl of fruit that had red grapes in it  Pt states she has been using benadryl po at home  Pt was given benadryl 50mg  po by EMS prior to arrival

## 2017-02-04 NOTE — ED Provider Notes (Signed)
[facility]ER[facility]ENCY DEPT Provider Note   CSN: [id number] Arrival date & time: 02/04/17  0346     History   Chief Complaint Chief Complaint  Patient presents with  . Rash    HPI Mary Oneill is a 41 y.o. female.  Patient presents with right facial rash for the past 2 days. No sore throat or throat tightening and no SOB. She has been using Benadryl with minimal temporary relief.  She reports the rash is burning more than itching. No blisters, or drainage. She is wondering if she is reacting to red grapes that she ate 4 days ago, however, reports she is using a new hair product that she started last week.    The history is provided by the patient. No language interpreter was used.  Rash      Past Medical History:  Diagnosis Date  . Hypertension     There are no active problems to display for this patient.   History reviewed. No pertinent surgical history.  OB History    No data available       Home Medications    Prior to Admission medications   Medication Sig Start Date End Date Taking? Authorizing Provider  hydrochlorothiazide (HYDRODIURIL) 12.5 MG tablet Take 1 tablet (12.5 mg total) by mouth daily. 03/24/16   Ace Gins Sam, PA-C  HYDROcodone-acetaminophen (NORCO/VICODIN) 5-325 MG per tablet Take 1-2 tablets by mouth every 6 (six) hours as needed. 03/08/15   Ladona Mow, PA-C  ibuprofen (ADVIL,MOTRIN) 800 MG tablet Take 1 tablet (800 mg total) by mouth every 8 (eight) hours as needed for moderate pain. 08/17/15   Charm Rings, MD  naproxen (NAPROSYN) 375 MG tablet Take 1 tablet (375 mg total) by mouth 2 (two) times daily. 06/21/16   Roxy Horseman, PA-C  OVER THE COUNTER MEDICATION Place 1 drop into both eyes daily as needed (eye irritattion).    Historical Provider, MD  traMADol (ULTRAM) 50 MG tablet Take 1 tablet (50 mg total) by mouth every 6 (six) hours as needed. 08/17/15   Charm Rings, MD    Family History History reviewed. No pertinent family history.  Social  History Social History  Substance Use Topics  . Smoking status: Current Every Day Smoker  . Smokeless tobacco: Never Used  . Alcohol use Yes     Comment: socially     Allergies   Patient has no known allergies.   Review of Systems Review of Systems  Constitutional: Negative for fever.  HENT: Negative for sore throat and trouble swallowing.   Respiratory: Negative for cough and shortness of breath.   Gastrointestinal: Negative for vomiting.  Musculoskeletal: Negative for myalgias.  Skin: Positive for rash.     Physical Exam Updated Vital Signs BP 122/81 (BP Location: Left Arm)   Pulse 94   Temp 98.7 F (37.1 C) (Oral)   Resp 18   Ht 5\' 4"  (1.626 m)   Wt 71.7 kg   SpO2 100%   BMI 27.12 kg/m   Physical Exam  Constitutional: She is oriented to person, place, and time. She appears well-developed and well-nourished. No distress.  HENT:  Mouth/Throat: Oropharynx is clear and moist.  Eyes: Conjunctivae are normal.  Neck: Normal range of motion. Neck supple.  Cardiovascular: Normal rate.   Pulmonary/Chest: Effort normal. She has no wheezes. She has no rales.  Neurological: She is alert and oriented to person, place, and time.  Skin: Skin is warm and dry. There is erythema.  Right facial erythema that  extends to posterior neck without significantly raised or definable rash. No hives. No blisters or scaling.      ED Treatments / Results  Labs (all labs ordered are listed, but only abnormal results are displayed) Labs Reviewed - No data to display  EKG  EKG Interpretation None       Radiology No results found.  Procedures Procedures (including critical care time)  Medications Ordered in ED Medications - No data to display   Initial Impression / Assessment and Plan / ED Course  I have reviewed the triage vital signs and the nursing notes.  Pertinent labs & imaging results that were available during my care of the patient were reviewed by me and  considered in my medical decision making (see chart for details).     Facial rash extending to posterior neck likely reaction to new hair product. No difficulty swallowing or SOB. Will start on prednisone and continue Benadryl. Return precautions discussed.   Final Clinical Impressions(s) / ED Diagnoses   Final diagnoses:  None   1. Acute allergic reaction  New Prescriptions New Prescriptions   No medications on file     Elpidio AnisShari Libbey Duce, PA-C 02/04/17 0421    Devoria AlbeIva Knapp, MD 02/04/17 0430

## 2017-05-30 ENCOUNTER — Encounter (HOSPITAL_COMMUNITY): Payer: Self-pay | Admitting: Emergency Medicine

## 2017-05-30 ENCOUNTER — Emergency Department (HOSPITAL_COMMUNITY)
Admission: EM | Admit: 2017-05-30 | Discharge: 2017-05-30 | Disposition: A | Discharge status: Home / Self Care | Payer: Self-pay | Attending: Emergency Medicine | Admitting: Emergency Medicine

## 2017-05-30 ENCOUNTER — Emergency Department (HOSPITAL_COMMUNITY): Payer: Self-pay

## 2017-05-30 DIAGNOSIS — Z79899 Other long term (current) drug therapy: Secondary | ICD-10-CM | POA: Insufficient documentation

## 2017-05-30 DIAGNOSIS — F17210 Nicotine dependence, cigarettes, uncomplicated: Secondary | ICD-10-CM | POA: Insufficient documentation

## 2017-05-30 DIAGNOSIS — G8929 Other chronic pain: Secondary | ICD-10-CM | POA: Insufficient documentation

## 2017-05-30 DIAGNOSIS — Z791 Long term (current) use of non-steroidal anti-inflammatories (NSAID): Secondary | ICD-10-CM | POA: Insufficient documentation

## 2017-05-30 DIAGNOSIS — M25512 Pain in left shoulder: Secondary | ICD-10-CM | POA: Insufficient documentation

## 2017-05-30 DIAGNOSIS — I10 Essential (primary) hypertension: Secondary | ICD-10-CM | POA: Insufficient documentation

## 2017-05-30 MED ORDER — MELOXICAM 7.5 MG PO TABS: 7.5000 mg | ORAL_TABLET | Freq: Every day | ORAL | 0 refills | Status: DC

## 2017-05-30 NOTE — ED Triage Notes (Signed)
Pt has left shoudler pain-- does repetitive type work, hurts to lift, hurts to move arm above waist level-- [facility] -- has taken steroids with short term relief, motrin temporary relief.

## 2017-05-30 NOTE — Discharge Instructions (Signed)
Return if any problems.

## 2017-05-30 NOTE — ED Provider Notes (Signed)
[facility] DEPT Provider Note   CSN: [id number] Arrival date & time: 05/30/17  1532  By signing my name below, I, Mary Oneill, attest that this documentation has been prepared under the direction and in the presence of Keymarion Bearman K. Alexzander Dolinger PA-C. Electronically Signed: Deland Oneill, ED Scribe. 05/30/17. 5:26 PM.  History   Chief Complaint Chief Complaint  Patient presents with  . Shoulder Pain   The history is provided by the patient. No language interpreter was used.   HPI Comments: Mary Oneill is a 41 y.o. female who presents to the Emergency Department complaining of gradually worsening, moderate shoulder pain s/p a mechanical fall that occurred a few years ago. She also notes bilateral wrist and hand pain with associated  Numbness that began fairly recently. She states that after speaking with her PCP she was prescribed steroids for her pain with inadequate relief.  The pt has a PMHx of HTN.  Past Medical History:  Diagnosis Date  . Hypertension     There are no active problems to display for this patient.   History reviewed. No pertinent surgical history.  OB History    No data available       Home Medications    Prior to Admission medications   Medication Sig Start Date End Date Taking? Authorizing Provider  hydrochlorothiazide (HYDRODIURIL) 12.5 MG tablet Take 1 tablet (12.5 mg total) by mouth daily. 03/24/16   Sam, Ace Gins, PA-C  HYDROcodone-acetaminophen (NORCO/VICODIN) 5-325 MG per tablet Take 1-2 tablets by mouth every 6 (six) hours as needed. 03/08/15   Ladona Mow, PA-C  ibuprofen (ADVIL,MOTRIN) 800 MG tablet Take 1 tablet (800 mg total) by mouth every 8 (eight) hours as needed for moderate pain. 08/17/15   Charm Rings, MD  naproxen (NAPROSYN) 375 MG tablet Take 1 tablet (375 mg total) by mouth 2 (two) times daily. 06/21/16   Roxy Horseman, PA-C  OVER THE COUNTER MEDICATION Place 1 drop into both eyes daily as needed (eye irritattion).    [provider]  predniSONE (DELTASONE) 20 MG tablet Take 3 tablets (60 mg total) by mouth daily. 02/04/17   Elpidio Anis, PA-C  traMADol (ULTRAM) 50 MG tablet Take 1 tablet (50 mg total) by mouth every 6 (six) hours as needed. 08/17/15   Charm Rings, MD    Family History No family history on file.  Social History Social History  Substance Use Topics  . Smoking status: Current Every Day Smoker  . Smokeless tobacco: Never Used  . Alcohol use Yes     Comment: socially     Allergies   Patient has no known allergies.   Review of Systems Review of Systems  Musculoskeletal: Positive for arthralgias and back pain.  Neurological: Positive for numbness.  All other systems reviewed and are negative.    Physical Exam Updated Vital Signs BP (!) 107/57 (BP Location: Right Arm)   Pulse 83   Temp 99.1 F (37.3 C) (Oral)   Resp 16   Ht 5\' 3"  (1.6 m)   Wt 158 lb (71.7 kg)   SpO2 100%   BMI 27.99 kg/m   Physical Exam  Constitutional: She is oriented to person, place, and time. She appears well-developed and well-nourished.  HENT:  Head: Normocephalic.  Eyes: EOM are normal.  Neck: Normal range of motion.  Cardiovascular:  Normal pulses.  Pulmonary/Chest: Effort normal.  Abdominal: She exhibits no distension.  Musculoskeletal: Normal range of motion.  Pain with ROM. Tenderness anteriorly and posteriorly.  Neurological: She is alert and oriented to person, place, and time.  Normal sensation.  Psychiatric: She has a normal mood and affect.  Nursing note and vitals reviewed.    ED Treatments / Results   DIAGNOSTIC STUDIES: Oxygen Saturation is 100% on RA, normal by my interpretation.   COORDINATION OF CARE: 5:22 PM-Discussed next steps with pt. Pt verbalized understanding and is agreeable with the plan.   Labs (all labs ordered are listed, but only abnormal results are displayed) Labs Reviewed - No data to display  EKG  EKG Interpretation None        Radiology No results found.  Procedures Procedures (including critical care time)  Medications Ordered in ED Medications - No data to display   Initial Impression / Assessment and Plan / ED Course  I have reviewed the triage vital signs and the nursing notes.  Pertinent labs & imaging results that were available during my care of the patient were reviewed by me and considered in my medical decision making (see chart for details).     Xray no acute.   I suspect tendonitis.  Pt has had problems for extended period of time  I will try mobic.  Pt advised follow up with Orthopaedist if pain persist.  Final Clinical Impressions(s) / ED Diagnoses   Final diagnoses:  Chronic left shoulder pain    New Prescriptions New Prescriptions   MELOXICAM (MOBIC) 7.5 MG TABLET    Take 1 tablet (7.5 mg total) by mouth daily.   An After Visit Summary was printed and given to the patient.    I personally performed the services in this documentation, which was scribed in my presence.  The recorded information has been reviewed and considered.   Barnet PallKaren SofiaPAC.   Osie CheeksSofia, Tameeka Luo K, PA-C 05/30/17 1857    Doug SouJacubowitz, Sam, MD 05/30/17 2135

## 2017-07-27 ENCOUNTER — Ambulatory Visit (HOSPITAL_COMMUNITY)
Admission: EM | Admit: 2017-07-27 | Discharge: 2017-07-27 | Disposition: A | Discharge status: Home / Self Care | Payer: Self-pay | Attending: Family Medicine | Admitting: Family Medicine

## 2017-07-27 ENCOUNTER — Ambulatory Visit (INDEPENDENT_AMBULATORY_CARE_PROVIDER_SITE_OTHER): Payer: Self-pay

## 2017-07-27 ENCOUNTER — Encounter (HOSPITAL_COMMUNITY): Payer: Self-pay | Admitting: Nurse Practitioner

## 2017-07-27 DIAGNOSIS — W19XXXA Unspecified fall, initial encounter: Secondary | ICD-10-CM

## 2017-07-27 DIAGNOSIS — S82891A Other fracture of right lower leg, initial encounter for closed fracture: Secondary | ICD-10-CM

## 2017-07-27 DIAGNOSIS — M25571 Pain in right ankle and joints of right foot: Secondary | ICD-10-CM

## 2017-07-27 MED ORDER — HYDROCODONE-ACETAMINOPHEN 5-325 MG PO TABS: 2.0000 | ORAL_TABLET | Freq: Four times a day (QID) | ORAL | 0 refills | Status: DC | PRN

## 2017-07-27 MED ORDER — HYDROCODONE-ACETAMINOPHEN 5-325 MG PO TABS: 1.0000 | ORAL_TABLET | Freq: Four times a day (QID) | ORAL | 0 refills | Status: DC | PRN

## 2017-07-27 MED ORDER — MELOXICAM 7.5 MG PO TABS: 7.5000 mg | ORAL_TABLET | Freq: Every day | ORAL | 0 refills | Status: DC

## 2017-07-27 NOTE — Discharge Instructions (Signed)
Wear cam walker with crutches. Take Meloxicam daily as directed. Take Norco for severe breakthrough pain. Narcotics can make you drowsy, please be careful with movement. Ice compress and elevate leg. Follow up with orthopedics for further evaluation and management of ankle fracture. If experiencing numbness/tingling of toes, swelling of toes/changes in color, follow up for reevaluation.

## 2017-07-27 NOTE — ED Triage Notes (Signed)
Pt presents with c/o right lower extremity injury. The injury occurred yesterday when she slipped on wet ground outside. Shes had pain in her right leg from knee to foot since. Shes tried soaking the leg in epsom salt, ice, ibuprofen with minimal relief

## 2017-07-27 NOTE — ED Provider Notes (Signed)
[facility]    CSN: [id number] Arrival date & time: 07/27/17  1655     History   Chief Complaint Chief Complaint  Patient presents with  . Leg Injury    HPI Mary Oneill is a 41 y.o. female.   41 year old female with history of hypertension comes in for 2 day history of right ankle pain after falling yesterday. She states she stepped on a walnut, slipped and inverted her ankle. Denies head injury, loss of consciousness. She was able to slightly bear weight to limp back home and apply ice to the location. She has taken ibuprofen without relief. She has been using crutches to avoid bearing weight due to the pain. Ankle is swollen, without erythema or increased warmth. Denies numbness/tingling.       Past Medical History:  Diagnosis Date  . Hypertension     There are no active problems to display for this patient.   History reviewed. No pertinent surgical history.  OB History    No data available       Home Medications    Prior to Admission medications   Medication Sig Start Date End Date Taking? Authorizing Provider  hydrochlorothiazide (HYDRODIURIL) 12.5 MG tablet Take 1 tablet (12.5 mg total) by mouth daily. 03/24/16  Yes Sam, Serena Y, PA-C  HYDROcodone-acetaminophen (NORCO/VICODIN) 5-325 MG tablet Take 1 tablet by mouth every 6 (six) hours as needed for severe pain. 07/27/17   Cathie Hoops, Abdelaziz Westenberger V, PA-C  meloxicam (MOBIC) 7.5 MG tablet Take 1 tablet (7.5 mg total) by mouth daily. 07/27/17   Belinda Fisher, PA-C    Family History History reviewed. No pertinent family history.  Social History Social History  Substance Use Topics  . Smoking status: Current Every Day Smoker  . Smokeless tobacco: Never Used  . Alcohol use Yes     Comment: socially     Allergies   Patient has no known allergies.   Review of Systems Review of Systems  Reason unable to perform ROS: See HPI as above.     Physical Exam Triage Vital Signs ED Triage Vitals  Enc Vitals Group    BP 07/27/17 1729 128/81     Pulse Rate 07/27/17 1729 81     Resp 07/27/17 1729 16     Temp 07/27/17 1729 98.2 F (36.8 C)     Temp Source 07/27/17 1729 Oral     SpO2 07/27/17 1729 100 %     Weight --      Height --      Head Circumference --      Peak Flow --      Pain Score 07/27/17 1730 8     Pain Loc --      Pain Edu? --      Excl. in GC? --    No data found.   Updated Vital Signs BP 128/81   Pulse 81   Temp 98.2 F (36.8 C) (Oral)   Resp 16   SpO2 100%     Physical Exam  Constitutional: She is oriented to person, place, and time. She appears well-developed and well-nourished. No distress.  HENT:  Head: Normocephalic and atraumatic.  Eyes: Pupils are equal, round, and reactive to light. Conjunctivae are normal.  Musculoskeletal:  Swelling around right ankle and foot. No contusions noted. Tenderness on palpation of right lateral malleolus. Tenderness on palpation of the MTPs. Decreased range of motion. Sensation intact. Pedal pulses 2+ and equal bilaterally. Cap refill unable to assess due to  nail polish.  Neurological: She is alert and oriented to person, place, and time.     UC Treatments / Results  Labs (all labs ordered are listed, but only abnormal results are displayed) Labs Reviewed - No data to display  EKG  EKG Interpretation None       Radiology Dg Ankle Complete Right  Result Date: 07/27/2017 CLINICAL DATA:  Fall, pain. EXAM: RIGHT ANKLE - COMPLETE 3+ VIEW COMPARISON:  None. FINDINGS: There is a fracture through the tip of the right lateral malleolus. Possible tiny avulsed fragment off the medial malleolus. Diffuse soft tissue swelling, most pronounced laterally. IMPRESSION: Fracture through the tip of the lateral malleolus. Possible small avulsed fragment off the medial malleolus. Electronically Signed   By: Charlett NoseKevin  Dover M.D.   On: 07/27/2017 18:51    Procedures Procedures (including critical care time)  Medications Ordered in UC Medications  - No data to display   Initial Impression / Assessment and Plan / UC Course  I have reviewed the triage vital signs and the nursing notes.  Pertinent labs & imaging results that were available during my care of the patient were reviewed by me and considered in my medical decision making (see chart for details).     Cam Walker with crutches. Meloxicam as directed. Norco for breakthrough pain. Follow up with orthopedics for further evaluation and management. Return precautions given.   Final Clinical Impressions(s) / UC Diagnoses   Final diagnoses:  Closed fracture of right ankle, initial encounter    New Prescriptions Discharge Medication List as of 07/27/2017  7:04 PM       Controlled Substance Prescriptions [place] Controlled Substance Registry consulted? Yes, I have consulted the [place] Controlled Substances Registry for this patient, and feel the risk/benefit ratio today is favorable for proceeding with this prescription for a controlled substance.   Belinda FisherYu, Dewayne Jurek V, PA-C 07/27/17 1920

## 2018-11-28 ENCOUNTER — Ambulatory Visit (INDEPENDENT_AMBULATORY_CARE_PROVIDER_SITE_OTHER): Payer: BLUE CROSS/BLUE SHIELD

## 2018-11-28 ENCOUNTER — Ambulatory Visit (HOSPITAL_COMMUNITY)
Admission: EM | Admit: 2018-11-28 | Discharge: 2018-11-28 | Disposition: A | Discharge status: Home / Self Care | Payer: BLUE CROSS/BLUE SHIELD | Attending: Family Medicine | Admitting: Family Medicine

## 2018-11-28 ENCOUNTER — Encounter (HOSPITAL_COMMUNITY): Payer: Self-pay | Admitting: Emergency Medicine

## 2018-11-28 ENCOUNTER — Other Ambulatory Visit: Payer: Self-pay

## 2018-11-28 DIAGNOSIS — S62502A Fracture of unspecified phalanx of left thumb, initial encounter for closed fracture: Secondary | ICD-10-CM | POA: Insufficient documentation

## 2018-11-28 DIAGNOSIS — W230XXA Caught, crushed, jammed, or pinched between moving objects, initial encounter: Secondary | ICD-10-CM

## 2018-11-28 DIAGNOSIS — S6992XA Unspecified injury of left wrist, hand and finger(s), initial encounter: Secondary | ICD-10-CM

## 2018-11-28 DIAGNOSIS — I10 Essential (primary) hypertension: Secondary | ICD-10-CM

## 2018-11-28 MED ORDER — NAPROXEN 375 MG PO TABS: 375.0000 mg | ORAL_TABLET | Freq: Two times a day (BID) | ORAL | 0 refills | Status: DC

## 2018-11-28 NOTE — ED Triage Notes (Signed)
Jammed left thumb against freezer on Sunday.  Thumb is swollen and painful

## 2018-11-28 NOTE — Discharge Instructions (Signed)
X-rays did show small fracture at base of thumb Thumb spica brace placed Continue conservative management of rest, ice, and elevation Take naproxen as needed for pain relief (may cause abdominal discomfort, ulcers, and GI bleeds avoid taking with other NSAIDs) Follow up with orthopedist for further evaluation and managment Return or go to the ER if you have any new or worsening symptoms (fever, chills, chest pain, abdominal pain, changes in bowel or bladder habits, pain radiating into lower legs, etc...)

## 2018-11-28 NOTE — ED Provider Notes (Signed)
[facility]   [id number] 11/28/18 Arrival Time: 1816  NF:AOZHYCC:JOINT PAIN  SUBJECTIVE: History from: patient. Mary Oneill is a 43 y.o. female complains of left thumb pain that began 2 days ago.  Symptoms began after jamming her thumb into a freezer.  Localizes the pain to the base of thumb.  Describes the pain as constant and throbbing in character.  Has tried OTC medications and ice with minimal relief.  Symptoms are made worse with thumb ROM.  Denies similar symptoms in the past.  Complains of associated swelling. Denies fever, chills, erythema, ecchymosis, weakness, numbness and tingling.      ROS: As per HPI.  Past Medical History:  Diagnosis Date  . Hypertension    History reviewed. No pertinent surgical history. No Known Allergies No current facility-administered medications on file prior to encounter.    No current outpatient medications on file prior to encounter.   Social History   Socioeconomic History  . Marital status: Single    Spouse name: Not on file  . Number of children: Not on file  . Years of education: Not on file  . Highest education level: Not on file  Occupational History  . Not on file  Social Needs  . Financial resource strain: Not on file  . Food insecurity:    Worry: Not on file    Inability: Not on file  . Transportation needs:    Medical: Not on file    Non-medical: Not on file  Tobacco Use  . Smoking status: Current Every Day Smoker  . Smokeless tobacco: Never Used  Substance and Sexual Activity  . Alcohol use: Yes    Comment: socially  . Drug use: No  . Sexual activity: Not on file  Lifestyle  . Physical activity:    Days per week: Not on file    Minutes per session: Not on file  . Stress: Not on file  Relationships  . Social connections:    Talks on phone: Not on file    Gets together: Not on file    Attends religious service: Not on file    Active member of club or organization: Not on file    Attends meetings of clubs or  organizations: Not on file    Relationship status: Not on file  . Intimate partner violence:    Fear of current or ex partner: Not on file    Emotionally abused: Not on file    Physically abused: Not on file    Forced sexual activity: Not on file  Other Topics Concern  . Not on file  Social History Narrative  . Not on file   Family History  Problem Relation Age of Onset  . Hypertension Mother     OBJECTIVE:  Vitals:   11/28/18 1917  BP: (!) 155/87  Pulse: 76  Resp: 18  Temp: 98.5 F (36.9 C)  TempSrc: Temporal  SpO2: 100%    General appearance: AOx3; in no acute distress.  Head: NCAT Lungs: Normal respiratory effort Heart: Radial pulses 2+ bilaterally. Musculoskeletal: Left hand Inspection: Obvious swelling diffuse about the first digit Palpation: TTP over 1st MCP joint, proximal first phalanx, and thenar aspect; negative snuff box tenderness ROM: LROM about the thumb Strength:  3+/5 grip strength Skin: warm and dry Neurologic: Ambulates without difficulty; Sensation intact about the upper extremities Psychological: alert and cooperative; normal mood and affect   DIAGNOSTIC STUDIES:  Dg Finger Thumb Left  Result Date: 11/28/2018 CLINICAL DATA:  Pain and  swelling of the left thumb after injury on Sunday. EXAM: LEFT THUMB 2+V COMPARISON:  None. FINDINGS: Soft tissue overlap limits evaluation of the proximal left first finger, but there appears to be a tiny cortical lucency along the volar plate at the base of the proximal phalanx suggesting focal avulsion. Correlation for point tenderness is recommended. No dislocation or displacement. No other fractures identified. Soft tissues are unremarkable. IMPRESSION: Suggestion of focal avulsion fracture at the base of the proximal phalanx of the left first finger. Correlation for point tenderness is recommended. Electronically Signed   By: Burman Nieves M.D.   On: 11/28/2018 20:05     ASSESSMENT & PLAN:  1. Closed  avulsion fracture of phalanx of left thumb, initial encounter    Meds ordered this encounter  Medications  . naproxen (NAPROSYN) 375 MG tablet    Sig: Take 1 tablet (375 mg total) by mouth 2 (two) times daily.    Dispense:  20 tablet    Refill:  0    Order Specific Question:   Supervising Provider    Answer:   Eustace Moore [[id number]]   X-rays did show small fracture at base of thumb Thumb spica brace placed Continue conservative management of rest, ice, and elevation Take naproxen as needed for pain relief (may cause abdominal discomfort, ulcers, and GI bleeds avoid taking with other NSAIDs) Follow up with orthopedist for further evaluation and managment Return or go to the ER if you have any new or worsening symptoms (fever, chills, chest pain, abdominal pain, changes in bowel or bladder habits, pain radiating into lower legs, etc...)   Reviewed expectations re: course of current medical issues. Questions answered. Outlined signs and symptoms indicating need for more acute intervention. Patient verbalized understanding. After Visit Summary given.    Rennis Harding, PA-C 11/28/18 2045

## 2018-11-28 NOTE — ED Notes (Signed)
Patient has ice pack

## 2019-04-24 ENCOUNTER — Other Ambulatory Visit: Payer: Self-pay | Admitting: Internal Medicine

## 2019-04-24 DIAGNOSIS — Z1231 Encounter for screening mammogram for malignant neoplasm of breast: Secondary | ICD-10-CM

## 2019-06-11 ENCOUNTER — Ambulatory Visit: Payer: BLUE CROSS/BLUE SHIELD

## 2019-07-31 ENCOUNTER — Other Ambulatory Visit: Payer: Self-pay

## 2019-07-31 ENCOUNTER — Ambulatory Visit
Admission: RE | Admit: 2019-07-31 | Discharge: 2019-07-31 | Disposition: A | Discharge status: Home / Self Care | Payer: BLUE CROSS/BLUE SHIELD | Source: Ambulatory Visit | Attending: Internal Medicine | Admitting: Internal Medicine

## 2019-07-31 DIAGNOSIS — Z1231 Encounter for screening mammogram for malignant neoplasm of breast: Secondary | ICD-10-CM

## 2020-02-29 ENCOUNTER — Ambulatory Visit: Payer: Self-pay | Attending: Internal Medicine

## 2020-02-29 DIAGNOSIS — Z23 Encounter for immunization: Secondary | ICD-10-CM

## 2020-02-29 NOTE — Progress Notes (Signed)
   Covid-19 Vaccination Clinic  Name:  Mary Oneill    MRN: [id number] DOB: 11-04-1976  02/29/2020  Mary Oneill was observed post Covid-19 immunization for 15 minutes without incident. She was provided with Vaccine Information Sheet and instruction to access the V-Safe system.   Mary Oneill was instructed to call 911 with any severe reactions post vaccine: Marland Kitchen Difficulty breathing  . Swelling of face and throat  . A fast heartbeat  . A bad rash all over body  . Dizziness and weakness   Immunizations Administered    Name Date Dose VIS Date Route   Pfizer COVID-19 Vaccine 02/29/2020  1:25 PM 0.3 mL 10/26/2019 Intramuscular   Manufacturer: [employer], [employer]   Lot: [id number]   NDC: [address]-0

## 2020-03-24 ENCOUNTER — Ambulatory Visit: Payer: Self-pay | Attending: Internal Medicine

## 2020-03-24 DIAGNOSIS — Z23 Encounter for immunization: Secondary | ICD-10-CM

## 2020-03-24 NOTE — Progress Notes (Signed)
   Covid-19 Vaccination Clinic  Name:  [name]    MRN: [id number] DOB: January 12, 1976  03/24/2020  Mary Oneill was observed post Covid-19 immunization for 15 minutes without incident. She was provided with Vaccine Information Sheet and instruction to access the V-Safe system.   Mary Oneill was instructed to call 911 with any severe reactions post vaccine: Marland Kitchen Difficulty breathing  . Swelling of face and throat  . A fast heartbeat  . A bad rash all over body  . Dizziness and weakness   Immunizations Administered    Name Date Dose VIS Date Route   Pfizer COVID-19 Vaccine 03/24/2020  1:48 PM 0.3 mL 01/09/2019 Intramuscular   Manufacturer: [employer], [employer]   Lot: [id number]   NDC: [address]-8

## 2020-10-07 ENCOUNTER — Other Ambulatory Visit: Payer: Self-pay | Admitting: Internal Medicine

## 2020-10-07 DIAGNOSIS — Z1231 Encounter for screening mammogram for malignant neoplasm of breast: Secondary | ICD-10-CM

## 2020-10-18 ENCOUNTER — Ambulatory Visit (HOSPITAL_COMMUNITY)
Admission: EM | Admit: 2020-10-18 | Discharge: 2020-10-18 | Disposition: A | Discharge status: Home / Self Care | Payer: 59 | Attending: Family Medicine | Admitting: Family Medicine

## 2020-10-18 ENCOUNTER — Encounter (HOSPITAL_COMMUNITY): Payer: Self-pay

## 2020-10-18 ENCOUNTER — Other Ambulatory Visit: Payer: Self-pay

## 2020-10-18 DIAGNOSIS — M545 Low back pain, unspecified: Secondary | ICD-10-CM

## 2020-10-18 DIAGNOSIS — R200 Anesthesia of skin: Secondary | ICD-10-CM | POA: Diagnosis not present

## 2020-10-18 MED ORDER — PREDNISONE 20 MG PO TABS: 40.0000 mg | ORAL_TABLET | Freq: Every day | ORAL | 0 refills | Status: DC

## 2020-10-18 MED ORDER — CYCLOBENZAPRINE HCL 10 MG PO TABS: 10.0000 mg | ORAL_TABLET | Freq: Three times a day (TID) | ORAL | 0 refills | Status: DC | PRN

## 2020-10-18 NOTE — ED Triage Notes (Signed)
Pt c/o sharp side pain, shoulder pain and numbness in both hands and fingers X 3 months. Pt states she is still able to move her fingers around. Pt states she gets sharp headaches sometimes.

## 2020-10-18 NOTE — Discharge Instructions (Addendum)
Take ibuprofen and tylenol as needed for pain relief

## 2020-10-18 NOTE — ED Provider Notes (Signed)
[facility]    CSN: [id number] Arrival date & time: 10/18/20  1531      History   Chief Complaint Chief Complaint  Patient presents with  . Hand Pain  . Abdominal Pain    sides   . Numbness    R/L hand and fingers    HPI Mary Oneill is a 44 y.o. female.   Patient presenting today with acute on chronic left mid back pain that is dull at rest and sharp with movement as well as b/l hand numbness in middle 3 fingers. States sxs ongoing x 3 months. 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 Denies radiation down legs, numbness, tingling, urinary changes, abdominal pain, CP, SOB. The pain was so bad this morning that she had to stay home from work. She has not tried anything for sxs at home thus far.      Past Medical History:  Diagnosis Date  . Hypertension     There are no problems to display for this patient.   History reviewed. No pertinent surgical history.  OB History   No obstetric history on file.      Home Medications    Prior to Admission medications   Medication Sig Start Date End Date Taking? Authorizing Provider  cyclobenzaprine (FLEXERIL) 10 MG tablet Take 1 tablet (10 mg total) by mouth 3 (three) times daily as needed for muscle spasms. DO NOT DRINK ALCOHOL OR DRIVE WHILE TAKING THESE MEDICATIONS 10/18/20   Particia Nearing, PA-C  naproxen (NAPROSYN) 375 MG tablet Take 1 tablet (375 mg total) by mouth 2 (two) times daily. 11/28/18   Wurst, [place], PA-C  predniSONE (DELTASONE) 20 MG tablet Take 2 tablets (40 mg total) by mouth daily with breakfast. 10/18/20   Particia Nearing, PA-C    Family History Family History  Problem Relation Age of Onset  . Hypertension Mother     Social History Social History   Tobacco Use  . Smoking status: Current Every Day Smoker  . Smokeless tobacco: Never Used  Substance Use Topics  . Alcohol use: Yes    Comment: socially  . Drug  use: No     Allergies   Patient has no known allergies.   Review of Systems Review of Systems PER HPI    Physical Exam Triage Vital Signs ED Triage Vitals  Enc Vitals Group     BP 10/18/20 1614 125/75     Pulse Rate 10/18/20 1614 79     Resp 10/18/20 1614 16     Temp 10/18/20 1614 98.4 F (36.9 C)     Temp Source 10/18/20 1614 Oral     SpO2 10/18/20 1614 100 %     Weight --      Height --      Head Circumference --      Peak Flow --      Pain Score 10/18/20 1613 8     Pain Loc --      Pain Edu? --      Excl. in GC? --    No data found.  Updated Vital Signs BP 125/75 (BP Location: Left Arm)   Pulse 79   Temp 98.4 F (36.9 C) (Oral)   Resp 16   SpO2 100%   Visual Acuity Right Eye Distance:   Left Eye Distance:   Bilateral Distance:    Right Eye Near:   Left Eye Near:  Bilateral Near:     Physical Exam Vitals and nursing note reviewed.  Constitutional:      Appearance: Normal appearance. She is not ill-appearing.  HENT:     Head: Atraumatic.     Mouth/Throat:     Mouth: Mucous membranes are moist.     Pharynx: Oropharynx is clear.  Eyes:     Extraocular Movements: Extraocular movements intact.     Conjunctiva/sclera: Conjunctivae normal.  Cardiovascular:     Rate and Rhythm: Normal rate and regular rhythm.     Pulses: Normal pulses.     Heart sounds: Normal heart sounds.  Pulmonary:     Effort: Pulmonary effort is normal.     Breath sounds: Normal breath sounds. No wheezing or rales.  Abdominal:     General: Bowel sounds are normal. There is no distension.     Palpations: Abdomen is soft.     Tenderness: There is no abdominal tenderness. There is no guarding.  Musculoskeletal:        General: Tenderness (ttp left thoracic paraspinal muscles) present. No swelling or deformity. Normal range of motion.     Cervical back: Normal range of motion and neck supple.  Skin:    General: Skin is warm and dry.     Findings: No bruising, erythema or  rash.  Neurological:     Mental Status: She is alert and oriented to person, place, and time.     Sensory: No sensory deficit.     Motor: No weakness.     Gait: Gait normal.  Psychiatric:        Mood and Affect: Mood normal.        Thought Content: Thought content normal.        Judgment: Judgment normal.      UC Treatments / Results  Labs (all labs ordered are listed, but only abnormal results are displayed) Labs Reviewed - No data to display  EKG   Radiology No results found.  Procedures Procedures (including critical care time)  Medications Ordered in UC Medications - No data to display  Initial Impression / Assessment and Plan / UC Course  I have reviewed the triage vital signs and the nursing notes.  Pertinent labs & imaging results that were available during my care of the patient were reviewed by me and considered in my medical decision making (see chart for details).     Vitals and exam reassuring, suspect muscular pain in back and carpal tunnel in hands. Will tx with prednisone taper, flexeril, OTC pain relievers. Work note given. Return if worsening or not resolving.   Final Clinical Impressions(s) / UC Diagnoses   Final diagnoses:  Acute left-sided low back pain without sciatica  Bilateral hand numbness     Discharge Instructions     Take ibuprofen and tylenol as needed for pain relief    ED Prescriptions    Medication Sig Dispense Auth. Provider   predniSONE (DELTASONE) 20 MG tablet Take 2 tablets (40 mg total) by mouth daily with breakfast. 10 tablet Particia Nearing, PA-C   cyclobenzaprine (FLEXERIL) 10 MG tablet Take 1 tablet (10 mg total) by mouth 3 (three) times daily as needed for muscle spasms. DO NOT DRINK ALCOHOL OR DRIVE WHILE TAKING THESE MEDICATIONS 30 tablet Particia Nearing, [place]     PDMP not reviewed this encounter.   Particia Nearing, [place] 10/18/20 1744

## 2020-11-21 ENCOUNTER — Ambulatory Visit: Payer: Self-pay

## 2020-11-24 ENCOUNTER — Encounter (HOSPITAL_COMMUNITY): Payer: Self-pay

## 2020-11-24 ENCOUNTER — Ambulatory Visit (INDEPENDENT_AMBULATORY_CARE_PROVIDER_SITE_OTHER): Payer: 59

## 2020-11-24 ENCOUNTER — Other Ambulatory Visit: Payer: Self-pay

## 2020-11-24 ENCOUNTER — Ambulatory Visit (HOSPITAL_COMMUNITY)
Admission: EM | Admit: 2020-11-24 | Discharge: 2020-11-24 | Disposition: A | Discharge status: Home / Self Care | Payer: 59

## 2020-11-24 DIAGNOSIS — M2011 Hallux valgus (acquired), right foot: Secondary | ICD-10-CM

## 2020-11-24 DIAGNOSIS — M79671 Pain in right foot: Secondary | ICD-10-CM | POA: Diagnosis not present

## 2020-11-24 DIAGNOSIS — W19XXXA Unspecified fall, initial encounter: Secondary | ICD-10-CM | POA: Diagnosis not present

## 2020-11-24 MED ORDER — IBUPROFEN 600 MG PO TABS: 600.0000 mg | ORAL_TABLET | Freq: Four times a day (QID) | ORAL | 0 refills | Status: DC | PRN

## 2020-11-24 NOTE — ED Triage Notes (Signed)
Patient states she fell after her ankle gave out or twisted on saturday. Patient is unable to bear weight without severe pain and has limited ROM. Pt is aox4.

## 2020-11-24 NOTE — Discharge Instructions (Addendum)
Take the ibuprofen as prescribed.  Rest and elevate your foot.  Apply ice packs 2-3 times a day for up to 20 minutes each.  Wear the Ace wrap as needed for comfort.    Follow up with an orthopedist if your symptoms are not improving.    

## 2020-11-24 NOTE — ED Provider Notes (Signed)
[facility]    CSN: [id number] Arrival date & time: 11/24/20  1522      History   Chief Complaint Chief Complaint  Patient presents with  . Foot Pain    Right foot since saturday    HPI Mary Oneill is a 45 y.o. female.   Patient presents with pain in her right foot after she fell and twisted it 2 days ago.  She states the pain is worse with ambulation and weightbearing.  She denies numbness, weakness, redness, bruising, wounds, or other symptoms.  OTC treatment attempted at home.  Her medical history includes hypertension.  The history is provided by the patient and medical records.    Past Medical History:  Diagnosis Date  . Hypertension     There are no problems to display for this patient.   History reviewed. No pertinent surgical history.  OB History   No obstetric history on file.      Home Medications    Prior to Admission medications   Medication Sig Start Date End Date Taking? Authorizing Provider  hydrochlorothiazide (HYDRODIURIL) 25 MG tablet Take 25 mg by mouth daily.   Yes [provider]  ibuprofen (ADVIL) 600 MG tablet Take 1 tablet (600 mg total) by mouth every 6 (six) hours as needed. 11/24/20  Yes Mickie Bail, NP  cyclobenzaprine (FLEXERIL) 10 MG tablet Take 1 tablet (10 mg total) by mouth 3 (three) times daily as needed for muscle spasms. DO NOT DRINK ALCOHOL OR DRIVE WHILE TAKING THESE MEDICATIONS 10/18/20   Particia Nearing, PA-C  naproxen (NAPROSYN) 375 MG tablet Take 1 tablet (375 mg total) by mouth 2 (two) times daily. 11/28/18   Wurst, [place], PA-C  predniSONE (DELTASONE) 20 MG tablet Take 2 tablets (40 mg total) by mouth daily with breakfast. 10/18/20   Particia Nearing, PA-C    Family History Family History  Problem Relation Age of Onset  . Hypertension Mother     Social History Social History   Tobacco Use  . Smoking status: Current Every Day Smoker  . Smokeless tobacco: Never Used  Vaping Use  .  Vaping Use: Never used  Substance Use Topics  . Alcohol use: Yes    Comment: socially  . Drug use: No     Allergies   Patient has no known allergies.   Review of Systems Review of Systems  Constitutional: Negative for chills and fever.  HENT: Negative for ear pain and sore throat.   Eyes: Negative for pain and visual disturbance.  Respiratory: Negative for cough and shortness of breath.   Cardiovascular: Negative for chest pain and palpitations.  Gastrointestinal: Negative for abdominal pain and vomiting.  Genitourinary: Negative for dysuria and hematuria.  Musculoskeletal: Positive for arthralgias. Negative for back pain.  Skin: Negative for color change and rash.  Neurological: Negative for syncope, weakness and numbness.  All other systems reviewed and are negative.    Physical Exam Triage Vital Signs ED Triage Vitals  Enc Vitals Group     BP 11/24/20 1603 134/76     Pulse Rate 11/24/20 1603 78     Resp 11/24/20 1603 18     Temp 11/24/20 1603 98.3 F (36.8 C)     Temp Source 11/24/20 1603 Oral     SpO2 11/24/20 1603 100 %     Weight --      Height --      Head Circumference --      Peak Flow --  Pain Score 11/24/20 1605 10     Pain Loc --      Pain Edu? --      Excl. in GC? --    No data found.  Updated Vital Signs BP 134/76 (BP Location: Right Arm)   Pulse 78   Temp 98.3 F (36.8 C) (Oral)   Resp 18   SpO2 100%   Visual Acuity Right Eye Distance:   Left Eye Distance:   Bilateral Distance:    Right Eye Near:   Left Eye Near:    Bilateral Near:     Physical Exam Vitals and nursing note reviewed.  Constitutional:      General: She is not in acute distress.    Appearance: She is well-developed and well-nourished.  HENT:     Head: Normocephalic and atraumatic.     Mouth/Throat:     Mouth: Mucous membranes are moist.  Eyes:     Conjunctiva/sclera: Conjunctivae normal.  Cardiovascular:     Rate and Rhythm: Normal rate and regular rhythm.      Heart sounds: Normal heart sounds.  Pulmonary:     Effort: Pulmonary effort is normal. No respiratory distress.     Breath sounds: Normal breath sounds.  Abdominal:     Palpations: Abdomen is soft.     Tenderness: There is no abdominal tenderness.  Musculoskeletal:        General: Tenderness present. No swelling, deformity or edema.     Cervical back: Neck supple.       Feet:  Skin:    General: Skin is warm and dry.     Capillary Refill: Capillary refill takes less than 2 seconds.     Findings: No bruising, lesion or rash.  Neurological:     General: No focal deficit present.     Mental Status: She is alert and oriented to person, place, and time.     Sensory: No sensory deficit.     Motor: No weakness.     Gait: Gait abnormal.  Psychiatric:        Mood and Affect: Mood and affect and mood normal.        Behavior: Behavior normal.      UC Treatments / Results  Labs (all labs ordered are listed, but only abnormal results are displayed) Labs Reviewed - No data to display  EKG   Radiology DG Foot Complete Right  Result Date: 11/24/2020 CLINICAL DATA:  Right foot pain after fall.  Unable to bear weight. EXAM: RIGHT FOOT COMPLETE - 3+ VIEW COMPARISON:  None. FINDINGS: Mild hallux valgus. Alignment is otherwise maintained. There is no fracture. Mild degenerative change at the first metatarsal phalangeal joint. No other arthropathy. No erosion, bony destruction or periosteal reaction. No focal soft tissue abnormality. IMPRESSION: Slight hallux valgus and degenerative change at the first metatarsal phalangeal joint. Otherwise unremarkable radiographs of the foot. No fracture or acute findings. Electronically Signed   By: Narda Rutherford M.D.   On: 11/24/2020 18:14    Procedures Procedures (including critical care time)  Medications Ordered in UC Medications - No data to display  Initial Impression / Assessment and Plan / UC Course  I have reviewed the triage vital  signs and the nursing notes.  Pertinent labs & imaging results that were available during my care of the patient were reviewed by me and considered in my medical decision making (see chart for details).   Right foot pain.  Xray negative for acute abnormality.  Treating with  rest, elevation, ice packs, ace wrap, crutches, ibuprofen.  Instructed patient to follow-up with an orthopedist if her symptoms are not improving.  She agrees to plan of care.   Final Clinical Impressions(s) / UC Diagnoses   Final diagnoses:  Foot pain, right     Discharge Instructions     Take the ibuprofen as prescribed.  Rest and elevate your foot.  Apply ice packs 2-3 times a day for up to 20 minutes each.  Wear the Ace wrap as needed for comfort.    Follow up with an orthopedist if your symptoms are not improving.       ED Prescriptions    Medication Sig Dispense Auth. Provider   ibuprofen (ADVIL) 600 MG tablet Take 1 tablet (600 mg total) by mouth every 6 (six) hours as needed. 30 tablet Mickie Bail, NP     PDMP not reviewed this encounter.   Mickie Bail, NP 11/24/20 [phone]

## 2020-12-19 ENCOUNTER — Ambulatory Visit: Payer: Self-pay

## 2021-02-03 ENCOUNTER — Other Ambulatory Visit: Payer: Self-pay

## 2021-02-03 ENCOUNTER — Ambulatory Visit
Admission: RE | Admit: 2021-02-03 | Discharge: 2021-02-03 | Disposition: A | Discharge status: Home / Self Care | Payer: 59 | Source: Ambulatory Visit | Attending: Internal Medicine | Admitting: Internal Medicine

## 2021-02-03 DIAGNOSIS — Z1231 Encounter for screening mammogram for malignant neoplasm of breast: Secondary | ICD-10-CM

## 2021-02-28 ENCOUNTER — Other Ambulatory Visit: Payer: Self-pay

## 2021-02-28 ENCOUNTER — Encounter (HOSPITAL_COMMUNITY): Payer: Self-pay | Admitting: Emergency Medicine

## 2021-02-28 ENCOUNTER — Emergency Department (HOSPITAL_COMMUNITY)
Admission: EM | Admit: 2021-02-28 | Discharge: 2021-02-28 | Discharge status: Against Medical Advice | Payer: 59 | Attending: Emergency Medicine | Admitting: Emergency Medicine

## 2021-02-28 DIAGNOSIS — M79642 Pain in left hand: Secondary | ICD-10-CM | POA: Diagnosis not present

## 2021-02-28 DIAGNOSIS — Z5321 Procedure and treatment not carried out due to patient leaving prior to being seen by health care provider: Secondary | ICD-10-CM | POA: Diagnosis not present

## 2021-02-28 DIAGNOSIS — M79641 Pain in right hand: Secondary | ICD-10-CM | POA: Insufficient documentation

## 2021-02-28 DIAGNOSIS — R200 Anesthesia of skin: Secondary | ICD-10-CM | POA: Diagnosis present

## 2021-02-28 NOTE — ED Notes (Signed)
No answer for VS x3 

## 2021-02-28 NOTE — ED Triage Notes (Signed)
Emergency Medicine Provider Triage Evaluation Note  Mary Oneill , a 45 y.o. female  was evaluated in triage.  Pt complains of numbness to bilateral hands for 1 year. Blurry vision for months. 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  Review of Systems  Positive: paresthesias Negative: weakness  Physical Exam  BP (!) 134/91 (BP Location: Right Arm)   Pulse 81   Temp 98.8 F (37.1 C) (Oral)   Resp 16   SpO2 100%  Gen:   Awake, no distress   HEENT:  Atraumatic  Resp:  Normal effort  Cardiac:  Normal rate  Abd:   Nondistended, nontender  MSK:   Moves extremities without difficulty  Neuro:  Speech clear   Medical Decision Making  Medically screening exam initiated at 6:54 PM.  Appropriate orders placed.  Mary Oneill was informed that the remainder of the evaluation will be completed by another provider, this initial triage assessment does not replace that evaluation, and the importance of remaining in the ED until their evaluation is complete.  Clinical Impression  No labwork indicated at this time as suspect radiculopathy   Dietrich Pates, PA-C 02/28/21 1856

## 2021-02-28 NOTE — ED Triage Notes (Signed)
Pt reports sharp pain and numbness to bilateral hands x 1 year.  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

## 2021-03-13 ENCOUNTER — Ambulatory Visit (HOSPITAL_COMMUNITY)
Admission: EM | Admit: 2021-03-13 | Discharge: 2021-03-13 | Disposition: A | Discharge status: Home / Self Care | Payer: 59 | Attending: Urgent Care | Admitting: Urgent Care

## 2021-03-13 ENCOUNTER — Encounter (HOSPITAL_COMMUNITY): Payer: Self-pay

## 2021-03-13 ENCOUNTER — Other Ambulatory Visit: Payer: Self-pay

## 2021-03-13 DIAGNOSIS — M545 Low back pain, unspecified: Secondary | ICD-10-CM | POA: Diagnosis not present

## 2021-03-13 DIAGNOSIS — M549 Dorsalgia, unspecified: Secondary | ICD-10-CM

## 2021-03-13 DIAGNOSIS — R202 Paresthesia of skin: Secondary | ICD-10-CM | POA: Diagnosis not present

## 2021-03-13 LAB — CBG MONITORING, ED: Glucose-Capillary: 96 mg/dL (ref 70–99)

## 2021-03-13 MED ORDER — NAPROXEN 375 MG PO TABS: 375.0000 mg | ORAL_TABLET | Freq: Two times a day (BID) | ORAL | 0 refills | Status: DC

## 2021-03-13 MED ORDER — TIZANIDINE HCL 4 MG PO TABS: 4.0000 mg | ORAL_TABLET | Freq: Three times a day (TID) | ORAL | 0 refills | Status: DC | PRN

## 2021-03-13 NOTE — ED Provider Notes (Signed)
[facility]   MRN: [id number] DOB: 08-May-1976  Subjective:   Mary Oneill is a 45 y.o. female presenting for several month history of persistent intermittent tingling of her hands, upper back pain along her trapezius on either side. Does not drink water. Drinks juices, sodas, alcohol every other day (2 [employer] lemonade). Denies fever, head aches, confusion, chest pain, belly pain, radicular symptoms, falls, trauma. No history of diabetes. Has used ibuprofen for her pain. Works as a Conservation officer, nature at [employer], eats at [employer] as well.   No current facility-administered medications for this encounter.  Current Outpatient Medications:  .  hydrochlorothiazide (HYDRODIURIL) 25 MG tablet, Take 25 mg by mouth daily., Disp: , Rfl:  .  cyclobenzaprine (FLEXERIL) 10 MG tablet, Take 1 tablet (10 mg total) by mouth 3 (three) times daily as needed for muscle spasms. DO NOT DRINK ALCOHOL OR DRIVE WHILE TAKING THESE MEDICATIONS, Disp: 30 tablet, Rfl: 0 .  ibuprofen (ADVIL) 600 MG tablet, Take 1 tablet (600 mg total) by mouth every 6 (six) hours as needed., Disp: 30 tablet, Rfl: 0 .  naproxen (NAPROSYN) 375 MG tablet, Take 1 tablet (375 mg total) by mouth 2 (two) times daily., Disp: 20 tablet, Rfl: 0 .  predniSONE (DELTASONE) 20 MG tablet, Take 2 tablets (40 mg total) by mouth daily with breakfast., Disp: 10 tablet, Rfl: 0   No Known Allergies  Past Medical History:  Diagnosis Date  . Hypertension      History reviewed. No pertinent surgical history.  Family History  Problem Relation Age of Onset  . Hypertension Mother     Social History   Tobacco Use  . Smoking status: Current Every Day Smoker    Packs/day: 0.50    Types: Cigarettes  . Smokeless tobacco: Never Used  Vaping Use  . Vaping Use: Never used  Substance Use Topics  . Alcohol use: Yes    Comment: socially  . Drug use: No    ROS   Objective:   Vitals: BP (!) 117/54   Pulse 98   Temp 98.8 F (37.1 C)    Resp 16   LMP  (LMP Unknown) Comment: pt states has not had periods since being on depo shot  SpO2 98%   Physical Exam Constitutional:      General: She is not in acute distress.    Appearance: Normal appearance. She is well-developed. She is not ill-appearing, toxic-appearing or diaphoretic.  HENT:     Head: Normocephalic and atraumatic.     Nose: Nose normal.     Mouth/Throat:     Mouth: Mucous membranes are moist.  Eyes:     Extraocular Movements: Extraocular movements intact.     Pupils: Pupils are equal, round, and reactive to light.  Cardiovascular:     Rate and Rhythm: Normal rate and regular rhythm.     Pulses: Normal pulses.     Heart sounds: Normal heart sounds. No murmur heard. No friction rub. No gallop.   Pulmonary:     Effort: Pulmonary effort is normal. No respiratory distress.     Breath sounds: Normal breath sounds. No stridor. No wheezing, rhonchi or rales.  Musculoskeletal:     Comments: Full range of motion throughout.  Strength 5/5 for upper and lower extremities.  Patient ambulates without any assistance at expected pace.  No ecchymosis, swelling, lacerations or abrasions.  Patient does have paraspinal muscle tenderness along the trapezius muscles and lower back excluding the midline.  Negative Spurling  and Lhermitte sign.  Negative Tinel and Phalen test for the wrist.  Full range of motion for both hands and wrist.  No ecchymosis, swelling, lacerations or abrasions.   Skin:    General: Skin is warm and dry.     Findings: No rash.  Neurological:     Mental Status: She is alert and oriented to person, place, and time.     Cranial Nerves: No cranial nerve deficit.     Motor: No weakness.     Coordination: Coordination normal.     Gait: Gait normal.     Deep Tendon Reflexes: Reflexes normal.  Psychiatric:        Mood and Affect: Mood normal.        Behavior: Behavior normal.        Thought Content: Thought content normal.        Judgment: Judgment normal.      Results for orders placed or performed during the hospital encounter of 03/13/21 (from the past 24 hour(s))  POC CBG monitoring     Status: None   Collection Time: 03/13/21  7:13 PM  Result Value Ref Range   Glucose-Capillary 96 70 - 99 mg/dL    Assessment and Plan :   PDMP not reviewed this encounter.  1. Acute bilateral low back pain without sciatica   2. Upper back pain   3. Paresthesia of both hands     Recommended conservative management including much better hydration, better diet, naproxen and tizanidine.  At this time patient does not have any signs of a neurologic process, radiculopathy.  Given that there was no trauma, will defer imaging.  Recommended close follow-up with her PCP.  If her bilateral hand tingling persists, recommend consultation with [facility].  She was negative for diabetes today. Counseled patient on potential for adverse effects with medications prescribed/recommended today, ER and return-to-clinic precautions discussed, patient verbalized understanding.    Wallis Bamberg, [place] 03/13/21 1925

## 2021-03-13 NOTE — ED Triage Notes (Signed)
Pt reports lower back and upper back pain with hand numbness/tingling bilaterally for months.

## 2021-04-08 ENCOUNTER — Ambulatory Visit (INDEPENDENT_AMBULATORY_CARE_PROVIDER_SITE_OTHER): Payer: 59 | Admitting: Family Medicine

## 2021-04-08 ENCOUNTER — Other Ambulatory Visit: Payer: Self-pay

## 2021-04-08 ENCOUNTER — Encounter: Payer: Self-pay | Admitting: Family Medicine

## 2021-04-08 VITALS — BP 118/70 | Ht 63.0 in | Wt 167.0 lb

## 2021-04-08 DIAGNOSIS — M545 Low back pain, unspecified: Secondary | ICD-10-CM | POA: Diagnosis not present

## 2021-04-08 DIAGNOSIS — G5603 Carpal tunnel syndrome, bilateral upper limbs: Secondary | ICD-10-CM | POA: Diagnosis not present

## 2021-04-08 MED ORDER — DICLOFENAC SODIUM 75 MG PO TBEC
75.0000 mg | DELAYED_RELEASE_TABLET | Freq: Two times a day (BID) | ORAL | 1 refills | Status: DC
Start: 2021-04-08 — End: 2022-04-02

## 2021-04-08 NOTE — Patient Instructions (Signed)
You have carpal tunnel syndrome. Wear the wrist brace at nighttime. Diclofenac 75mg  twice a day with food for pain and inflammation. A prednisone dose pack is a consideration. Corticosteroid injection is a consideration to help with pain and inflammation. If not improving, will consider nerve conduction studies to assess severity. Follow up with me in 1 month.  Your back pain is due to SI joint dysfunction. Do home stretches twice a day as directed. Diclofenac as above. Heat 15 minutes at a time as needed.

## 2021-04-08 NOTE — Progress Notes (Signed)
PCP: Pcp, No  Subjective:   HPI: Patient is a 45 y.o. female here for hand numbness and low back pain.  Hand numbness Ongoing for a few months. Thought to have started following second COVID shot. Has bilateral numbness of first 3 fingers. Wakes her up at night. Has not been able to afford wrist splints. Is left hand dominant and works at [employer]. No known injury. No hx of diabetes.   Low back pain Ongoing for a few months. Located on both sides of lower back. No known injury. No radiation into thigh or back of leg. Feels sore and will throb at night or when sleeping on her side.   Past Medical History:  Diagnosis Date  . Hypertension     Current Outpatient Medications on File Prior to Visit  Medication Sig Dispense Refill  . naproxen (NAPROSYN) 375 MG tablet Take 1 tablet (375 mg total) by mouth 2 (two) times daily with a meal. 30 tablet 0  . tiZANidine (ZANAFLEX) 4 MG tablet Take 1 tablet (4 mg total) by mouth every 8 (eight) hours as needed. 30 tablet 0  . hydrochlorothiazide (HYDRODIURIL) 25 MG tablet Take 25 mg by mouth daily.     No current facility-administered medications on file prior to visit.    No past surgical history on file.  No Known Allergies  Social History   Socioeconomic History  . Marital status: Single    Spouse name: Not on file  . Number of children: Not on file  . Years of education: Not on file  . Highest education level: Not on file  Occupational History  . Not on file  Tobacco Use  . Smoking status: Current Every Day Smoker    Packs/day: 0.50    Types: Cigarettes  . Smokeless tobacco: Never Used  Vaping Use  . Vaping Use: Never used  Substance and Sexual Activity  . Alcohol use: Yes    Comment: socially  . Drug use: No  . Sexual activity: Not Currently  Other Topics Concern  . Not on file  Social History Narrative  . Not on file   Social Determinants of Health   Financial Resource Strain: Not on file  Food Insecurity:  Not on file  Transportation Needs: Not on file  Physical Activity: Not on file  Stress: Not on file  Social Connections: Not on file  Intimate Partner Violence: Not on file    Family History  Problem Relation Age of Onset  . Hypertension Mother     BP 118/70   Ht 5\' 3"  (1.6 m)   Wt 167 lb (75.8 kg)   LMP  (LMP Unknown) Comment: pt states has not had periods since being on depo shot  BMI 29.58 kg/m   No flowsheet data found.  No flowsheet data found.  Review of Systems: See HPI above.     Objective:  Physical Exam:  Gen: NAD, comfortable in exam room  Bilateral wrists: No gross deformity, swelling, bruising. Non-TTP.  Interossei strength and thumb opposition  5/5.   Sensation intact to light touch.   Negative tinel. Positive phalen's.  NV intact distal BUEs.  Bilateral hip: No deformity. FROM with 5/5 strength. No tenderness to palpation. NVI distally. Negative logroll Positive Faber stretch with decreased motion Negative fadir and piriformis stretches.    Assessment & Plan:  1. Bilateral carpal tunnel syndrome Classic numbness distribution reported and reproduced with above special test.   Plan: -Wrist splints at night (left given today; right  possibly through insurance) -Diclofenac; consider prednisone -Consider injection and/or NCS if conservative measures fail to improve  -F/u in 1 month  2. SI joint dysfunction Localized back pain over PSIS. Decreased ROM at SI joint.   Plan: -Home stretches -NSAID as above.  -Heat PRN

## 2021-04-11 IMAGING — MG MM DIGITAL SCREENING BILAT W/ TOMO W/ CAD
8 of 14 series · 8 of 40 positions shown · non-contrast
Comparison: None.

CLINICAL DATA: Screening.

EXAM:
DIGITAL SCREENING BILATERAL MAMMOGRAM WITH TOMO AND CAD

[L MLO synth-2D (1 of 2)]
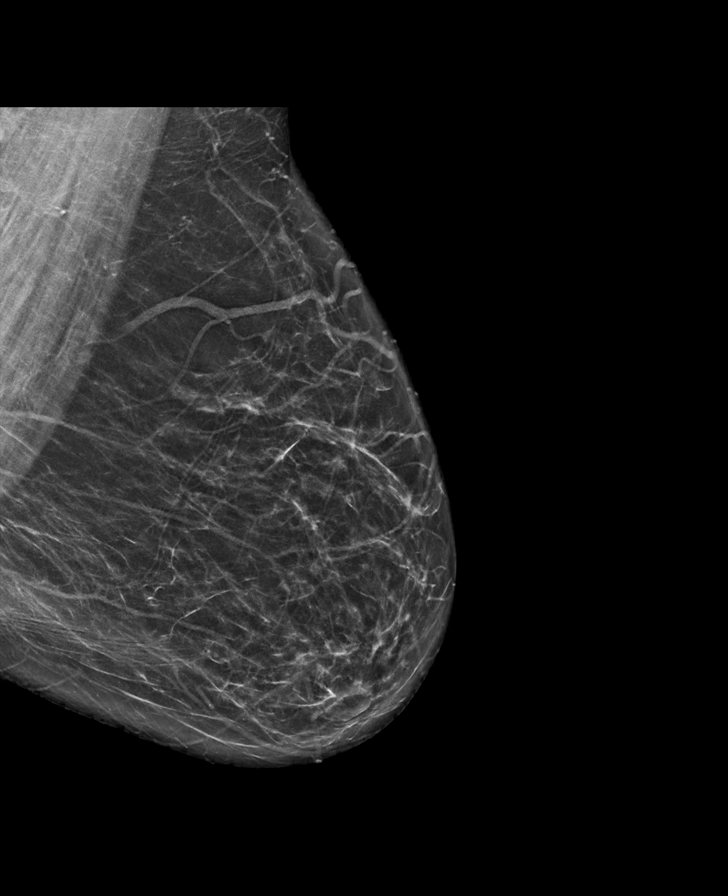

[R MLO synth-2D (1 of 2)]
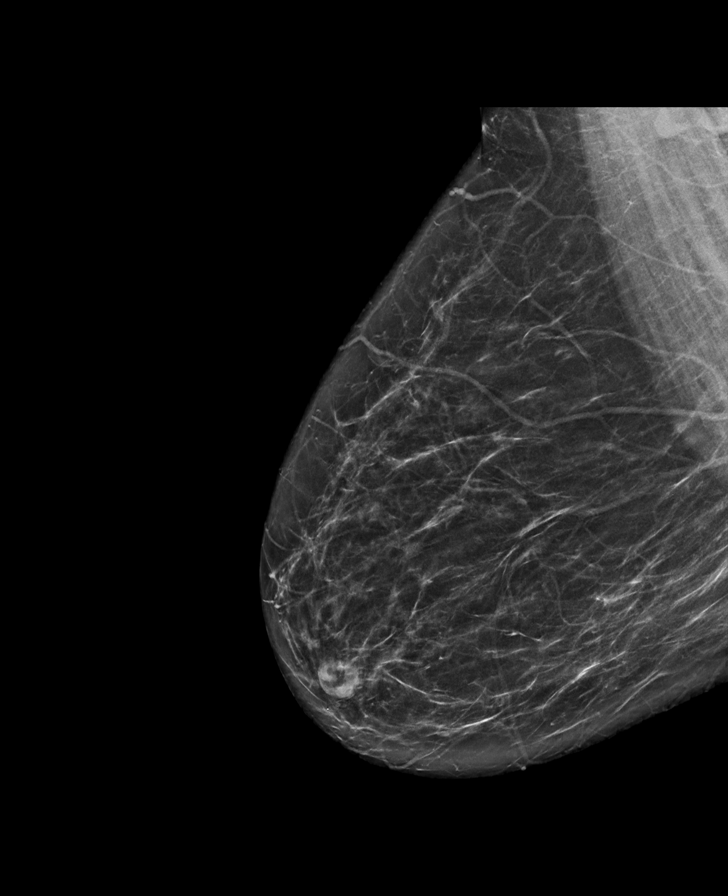

[R MLO synth-2D (2 of 2)]
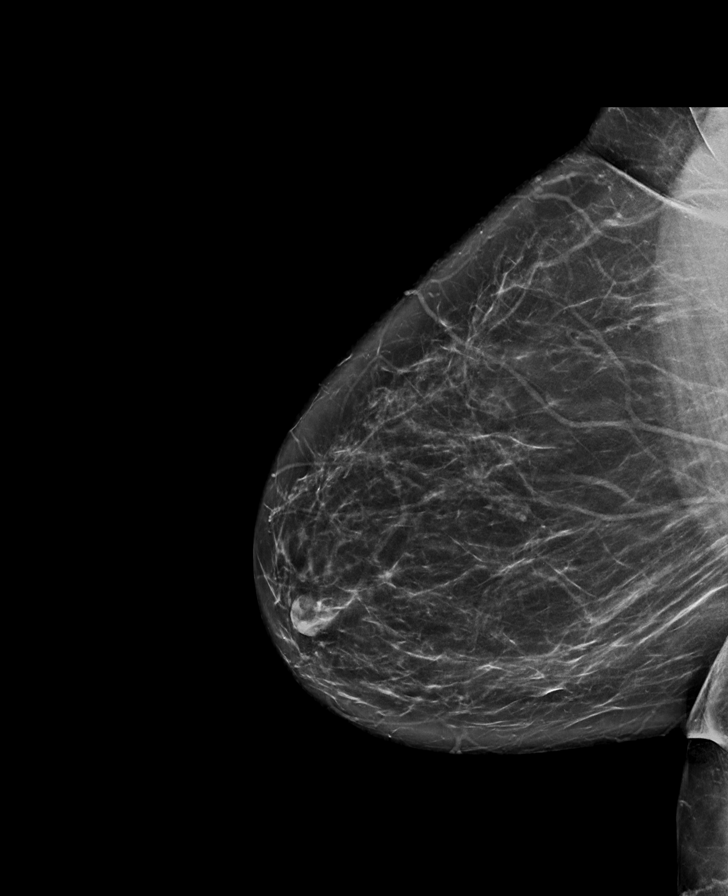

[L CC synth-2D]
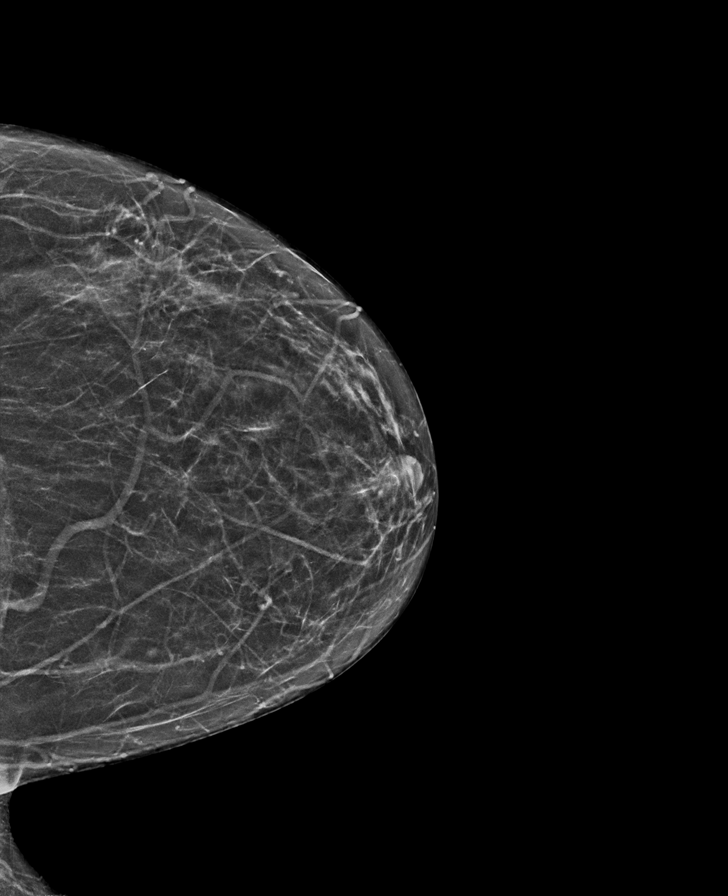

[L MLO synth-2D (2 of 2)]
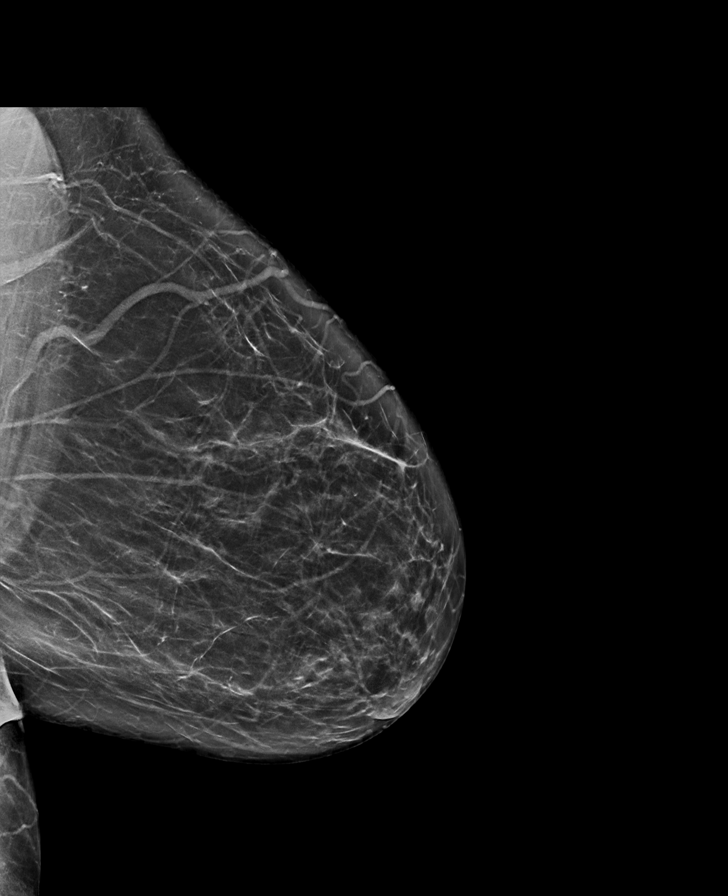

[R CC synth-2D (1 of 2)]
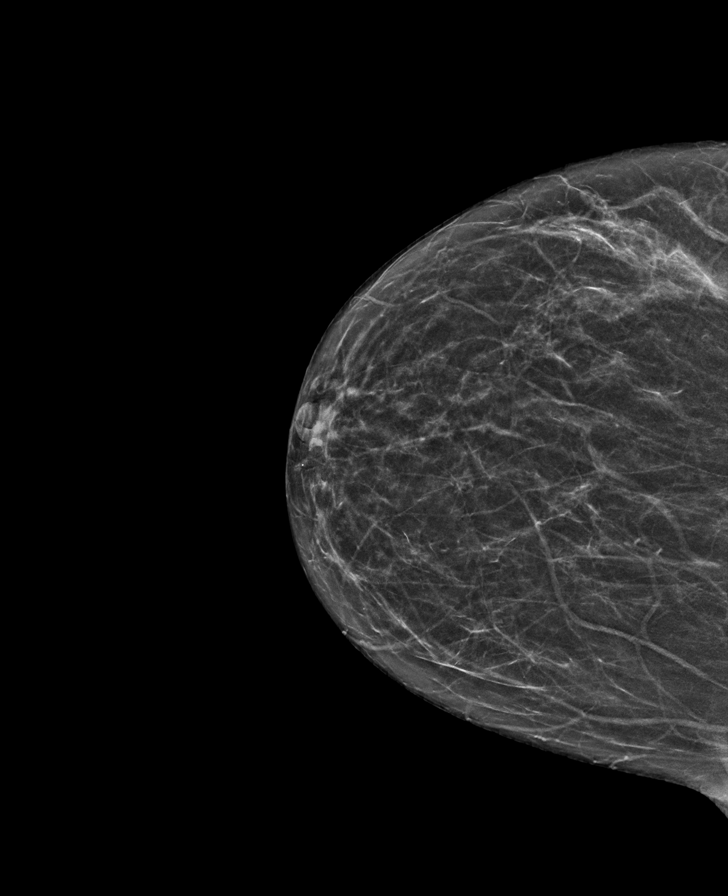

[R CC synth-2D (2 of 2)]
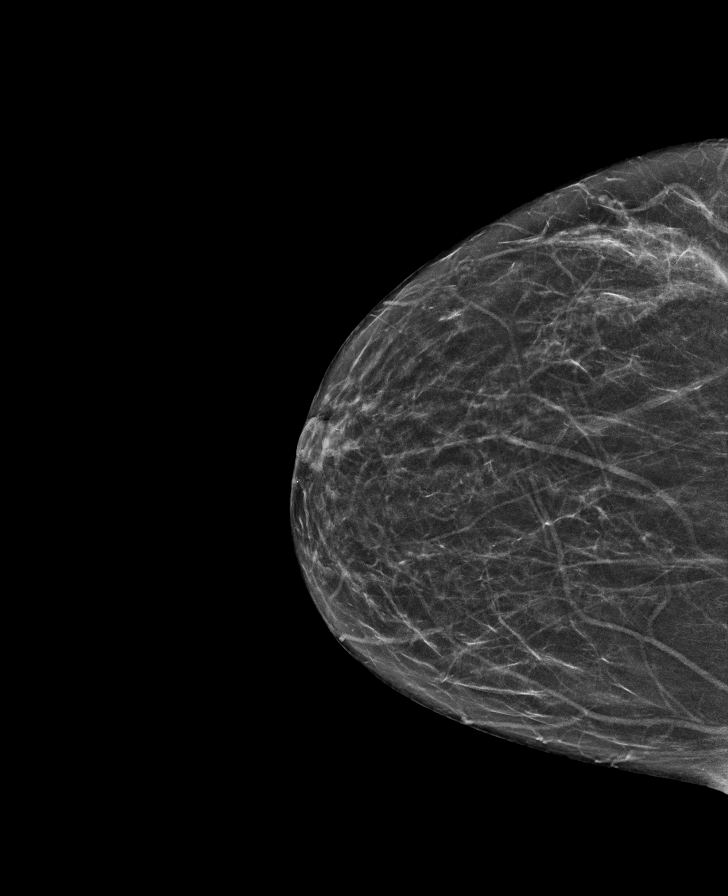

[L MLO tomo · tomo slice 36/71.0]
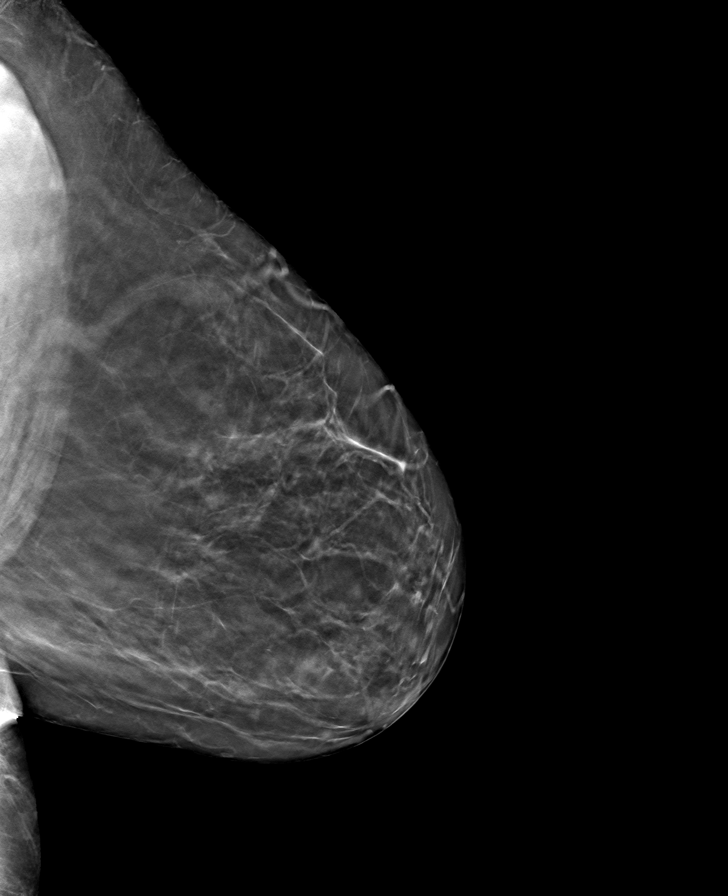

[8 of 40 positions shown; findings below may reference images not displayed]

ACR Breast Density Category b: There are scattered areas of
fibroglandular density.
FINDINGS: There are no findings suspicious for malignancy. Images were
processed with CAD.
IMPRESSION: No mammographic evidence of malignancy. A result letter of this
screening mammogram will be mailed directly to the patient.

RECOMMENDATION:
Screening mammogram in one year. (Code:Y5-G-EJ6)

BI-RADS CATEGORY  1: Negative.

## 2021-05-11 ENCOUNTER — Ambulatory Visit: Payer: 59 | Admitting: Family Medicine

## 2021-05-25 ENCOUNTER — Ambulatory Visit: Payer: 59 | Admitting: Family Medicine

## 2021-08-09 ENCOUNTER — Ambulatory Visit
Admission: EM | Admit: 2021-08-09 | Discharge: 2021-08-09 | Disposition: A | Discharge status: Home / Self Care | Payer: 59 | Attending: Internal Medicine | Admitting: Internal Medicine

## 2021-08-09 ENCOUNTER — Other Ambulatory Visit: Payer: Self-pay

## 2021-08-09 ENCOUNTER — Encounter (HOSPITAL_COMMUNITY): Payer: Self-pay | Admitting: Emergency Medicine

## 2021-08-09 ENCOUNTER — Emergency Department (HOSPITAL_COMMUNITY): Payer: 59

## 2021-08-09 ENCOUNTER — Emergency Department (HOSPITAL_COMMUNITY)
Admission: EM | Admit: 2021-08-09 | Discharge: 2021-08-09 | Disposition: A | Discharge status: Home / Self Care | Payer: 59 | Attending: Emergency Medicine | Admitting: Emergency Medicine

## 2021-08-09 ENCOUNTER — Encounter: Payer: Self-pay | Admitting: Emergency Medicine

## 2021-08-09 DIAGNOSIS — F17210 Nicotine dependence, cigarettes, uncomplicated: Secondary | ICD-10-CM | POA: Insufficient documentation

## 2021-08-09 DIAGNOSIS — Z79899 Other long term (current) drug therapy: Secondary | ICD-10-CM | POA: Insufficient documentation

## 2021-08-09 DIAGNOSIS — R1013 Epigastric pain: Secondary | ICD-10-CM | POA: Insufficient documentation

## 2021-08-09 DIAGNOSIS — R1011 Right upper quadrant pain: Secondary | ICD-10-CM | POA: Diagnosis not present

## 2021-08-09 DIAGNOSIS — I10 Essential (primary) hypertension: Secondary | ICD-10-CM | POA: Diagnosis not present

## 2021-08-09 DIAGNOSIS — R1031 Right lower quadrant pain: Secondary | ICD-10-CM | POA: Insufficient documentation

## 2021-08-09 DIAGNOSIS — R109 Unspecified abdominal pain: Secondary | ICD-10-CM | POA: Diagnosis not present

## 2021-08-09 LAB — POCT URINALYSIS DIP (MANUAL ENTRY)
Bilirubin, UA: NEGATIVE
Blood, UA: NEGATIVE
Glucose, UA: NEGATIVE mg/dL
Ketones, POC UA: NEGATIVE mg/dL
Leukocytes, UA: NEGATIVE
Nitrite, UA: NEGATIVE
Protein Ur, POC: 30 mg/dL — AB
Spec Grav, UA: 1.02 (ref 1.010–1.025)
Urobilinogen, UA: 0.2 E.U./dL
pH, UA: 7.5 (ref 5.0–8.0)

## 2021-08-09 LAB — CBC WITH DIFFERENTIAL/PLATELET
Abs Immature Granulocytes: 0.03 10*3/uL (ref 0.00–0.07)
Basophils Absolute: 0.1 10*3/uL (ref 0.0–0.1)
Basophils Relative: 1 %
Eosinophils Absolute: 0.3 10*3/uL (ref 0.0–0.5)
Eosinophils Relative: 4 %
HCT: 40 % (ref 36.0–46.0)
Hemoglobin: 13 g/dL (ref 12.0–15.0)
Immature Granulocytes: 0 %
Lymphocytes Relative: 41 %
Lymphs Abs: 3 10*3/uL (ref 0.7–4.0)
MCH: 28.5 pg (ref 26.0–34.0)
MCHC: 32.5 g/dL (ref 30.0–36.0)
MCV: 87.7 fL (ref 80.0–100.0)
Monocytes Absolute: 0.8 10*3/uL (ref 0.1–1.0)
Monocytes Relative: 10 %
Neutro Abs: 3.2 10*3/uL (ref 1.7–7.7)
Neutrophils Relative %: 44 %
Platelets: 327 10*3/uL (ref 150–400)
RBC: 4.56 MIL/uL (ref 3.87–5.11)
RDW: 14.9 % (ref 11.5–15.5)
WBC: 7.3 10*3/uL (ref 4.0–10.5)
nRBC: 0 % (ref 0.0–0.2)

## 2021-08-09 LAB — COMPREHENSIVE METABOLIC PANEL
ALT: 15 U/L (ref 0–44)
AST: 19 U/L (ref 15–41)
Albumin: 4.3 g/dL (ref 3.5–5.0)
Alkaline Phosphatase: 74 U/L (ref 38–126)
Anion gap: 8 (ref 5–15)
BUN: 10 mg/dL (ref 6–20)
CO2: 24 mmol/L (ref 22–32)
Calcium: 9.5 mg/dL (ref 8.9–10.3)
Chloride: 105 mmol/L (ref 98–111)
Creatinine, Ser: 0.76 mg/dL (ref 0.44–1.00)
GFR, Estimated: >60 mL/min (ref >60)
Glucose, Bld: 92 mg/dL (ref 70–99)
Potassium: 3.9 mmol/L (ref 3.5–5.1)
Sodium: 137 mmol/L (ref 135–145)
Total Bilirubin: 0.9 mg/dL (ref 0.3–1.2)
Total Protein: 7.8 g/dL (ref 6.5–8.1)

## 2021-08-09 LAB — LIPASE, BLOOD: Lipase: 40 U/L (ref 11–51)

## 2021-08-09 LAB — I-STAT BETA HCG BLOOD, ED (MC, WL, AP ONLY): I-stat hCG, quantitative: <5 m[IU]/mL (ref <5)

## 2021-08-09 LAB — URINALYSIS, ROUTINE W REFLEX MICROSCOPIC
Bacteria, UA: NONE SEEN
Bilirubin Urine: NEGATIVE
Glucose, UA: NEGATIVE mg/dL
Hgb urine dipstick: NEGATIVE
Ketones, ur: NEGATIVE mg/dL
Leukocytes,Ua: NEGATIVE
Nitrite: NEGATIVE
Protein, ur: NEGATIVE mg/dL
Specific Gravity, Urine: 1.015 (ref 1.005–1.030)
pH: 7 (ref 5.0–8.0)

## 2021-08-09 MED ORDER — LIDOCAINE 5 % EX PTCH: 1.0000 | MEDICATED_PATCH | CUTANEOUS | 0 refills | Status: AC

## 2021-08-09 MED ORDER — IOHEXOL 350 MG/ML SOLN
80.0000 mL | Freq: Once | INTRAVENOUS | Status: AC | PRN
  Administered 2021-08-09: 80 mL via INTRAVENOUS

## 2021-08-09 MED ORDER — ONDANSETRON HCL 4 MG/2ML IJ SOLN
4.0000 mg | Freq: Once | INTRAMUSCULAR | Status: AC
  Administered 2021-08-09: 4 mg via INTRAVENOUS
  Filled 2021-08-09: qty 2

## 2021-08-09 MED ORDER — MORPHINE SULFATE (PF) 4 MG/ML IV SOLN
4.0000 mg | Freq: Once | INTRAVENOUS | Status: AC
  Administered 2021-08-09: 4 mg via INTRAVENOUS
  Filled 2021-08-09: qty 1

## 2021-08-09 MED ORDER — SODIUM CHLORIDE 0.9 % IV BOLUS
1000.0000 mL | Freq: Once | INTRAVENOUS | Status: AC
  Administered 2021-08-09: 1000 mL via INTRAVENOUS

## 2021-08-09 MED ORDER — SODIUM CHLORIDE (PF) 0.9 % IJ SOLN
INTRAMUSCULAR | Status: AC
  Filled 2021-08-09: qty 50

## 2021-08-09 NOTE — ED Provider Notes (Signed)
[facility]-EMERGENCY DEPT Provider Note   CSN: [id number] Arrival date & time: 08/09/21  1441     History Chief Complaint  Patient presents with   Abdominal Pain   Flank Pain    Mary Oneill is a 45 y.o. female with a past medical history of hypertension presents today for evaluation of 9 days of right-sided abdominal pain, side pain, and flank pain.  She states that her pain has been constant except for when she is wearing a lidocaine patch on her flank.  She denies any fevers.  No nausea vomiting or diarrhea.  She denies any fevers, dysuria, frequency or urgency.  She denies any abnormal vaginal discharge.  Her pain is made worse with movement.  Has no history of similar, no prior abdominal surgeries.  Her last bowel movement was this morning and was normal for her.  She denies any rashes.   HPI     Past Medical History:  Diagnosis Date   Hypertension     There are no problems to display for this patient.   History reviewed. No pertinent surgical history.   OB History   No obstetric history on file.     Family History  Problem Relation Age of Onset   Hypertension Mother     Social History   Tobacco Use   Smoking status: Every Day    Packs/day: 0.50    Types: Cigarettes   Smokeless tobacco: Never  Vaping Use   Vaping Use: Never used  Substance Use Topics   Alcohol use: Yes    Comment: socially   Drug use: No    Home Medications Prior to Admission medications   Medication Sig Start Date End Date Taking? Authorizing Provider  lidocaine (LIDODERM) 5 % Place 1 patch onto the skin daily. After 12 hours remove patch and leave off for 12 hours before placing new patch. 08/09/21  Yes Cristina Gong, PA-C  diclofenac (VOLTAREN) 75 MG EC tablet Take 1 tablet (75 mg total) by mouth 2 (two) times daily. 04/08/21   Hudnall, Azucena Fallen, MD  hydrochlorothiazide (HYDRODIURIL) 25 MG tablet Take 25 mg by mouth daily.    [provider]   tiZANidine (ZANAFLEX) 4 MG tablet Take 1 tablet (4 mg total) by mouth every 8 (eight) hours as needed. 03/13/21   Wallis Bamberg, PA-C    Allergies    Patient has no known allergies.  Review of Systems   Review of Systems  Constitutional:  Negative for chills and fever.  Respiratory:  Negative for cough, chest tightness and shortness of breath.   Cardiovascular:  Negative for chest pain, palpitations and leg swelling.  Gastrointestinal:  Positive for abdominal pain. Negative for blood in stool, constipation, diarrhea, nausea and vomiting.  Genitourinary:  Positive for flank pain. Negative for decreased urine volume, difficulty urinating, dysuria, hematuria, menstrual problem, urgency and vaginal discharge.  Musculoskeletal:  Positive for back pain.  Skin:  Negative for color change and rash.  Neurological:  Negative for weakness and headaches.  All other systems reviewed and are negative.  Physical Exam Updated Vital Signs BP 135/90   Pulse 70   Temp 98 F (36.7 C) (Oral)   Resp 18   SpO2 100%   Physical Exam Vitals and nursing note reviewed.  Constitutional:      Appearance: She is not diaphoretic.     Comments: Tearful, appears uncomfortable.  HENT:     Head: Normocephalic and atraumatic.  Eyes:     General:  No scleral icterus.       Right eye: No discharge.        Left eye: No discharge.     Conjunctiva/sclera: Conjunctivae normal.  Cardiovascular:     Rate and Rhythm: Normal rate and regular rhythm.  Pulmonary:     Effort: Pulmonary effort is normal. No respiratory distress.     Breath sounds: No stridor.  Abdominal:     General: Abdomen is flat. Bowel sounds are normal. There is no distension.     Palpations: Abdomen is soft.     Tenderness: There is abdominal tenderness (Epigastric, right upper quadrant, and right lower quadrant, worst in the right lower quadrant). There is no guarding or rebound.     Hernia: No hernia is present.  Musculoskeletal:         General: No deformity.     Cervical back: Normal range of motion.  Skin:    General: Skin is warm and dry.  Neurological:     Mental Status: She is alert.     Motor: No abnormal muscle tone.     Comments: Patient is awake and alert.  Answers questions appropriately without difficulty.  Speech is not slurred.  Psychiatric:        Mood and Affect: Mood normal.        Behavior: Behavior normal.    ED Results / Procedures / Treatments   Labs (all labs ordered are listed, but only abnormal results are displayed) Labs Reviewed  COMPREHENSIVE METABOLIC PANEL  LIPASE, BLOOD  CBC WITH DIFFERENTIAL/PLATELET  URINALYSIS, ROUTINE W REFLEX MICROSCOPIC  I-STAT BETA HCG BLOOD, ED ([facility], [facility], [facility] ONLY)    EKG EKG Interpretation  Date/Time:  Sunday August 09 2021 15:39:46 EDT Ventricular Rate:  81 PR Interval:  152 QRS Duration: 76 QT Interval:  294 QTC Calculation: 342 R Axis:   14 Text Interpretation: Sinus rhythm Borderline T wave abnormalities No old tracing to compare Confirmed by Meridee Score [phone]) on 08/09/2021 3:41:44 PM  Radiology CT Abdomen Pelvis W Contrast  Result Date: 08/09/2021 CLINICAL DATA:  Right lower quadrant pain EXAM: CT ABDOMEN AND PELVIS WITH CONTRAST TECHNIQUE: Multidetector CT imaging of the abdomen and pelvis was performed using the standard protocol following bolus administration of intravenous contrast. CONTRAST:  50mL OMNIPAQUE IOHEXOL 350 MG/ML SOLN COMPARISON:  None. FINDINGS: Lower chest: No acute abnormality. Hepatobiliary: No focal liver abnormality is seen. No gallstones, gallbladder wall thickening, or biliary dilatation. Pancreas: Unremarkable. No pancreatic ductal dilatation or surrounding inflammatory changes. Spleen: Normal in size without focal abnormality. Adrenals/Urinary Tract: Adrenal glands are unremarkable. Kidneys are normal, without renal calculi, focal lesion, or hydronephrosis. Bladder is unremarkable. Stomach/Bowel: Stomach is within  normal limits. Appendix appears normal. No evidence of bowel wall thickening, distention, or inflammatory changes. Vascular/Lymphatic: No significant vascular findings are present. No enlarged abdominal or pelvic lymph nodes. Reproductive: Uterus and bilateral adnexa are unremarkable. Other: No abdominal wall hernia or abnormality. No abdominopelvic ascites. Musculoskeletal: No acute or significant osseous findings. IMPRESSION: No CT evidence for acute intra-abdominal or pelvic abnormality. Negative for acute appendicitis Electronically Signed   By: Jasmine Pang M.D.   On: 08/09/2021 20:09    Procedures Procedures   Medications Ordered in ED Medications  sodium chloride (PF) 0.9 % injection (has no administration in time range)  morphine 4 MG/ML injection 4 mg (4 mg Intravenous Given 08/09/21 1658)  ondansetron (ZOFRAN) injection 4 mg (4 mg Intravenous Given 08/09/21 1657)  sodium chloride 0.9 % bolus 1,000 mL (  0 mLs Intravenous Stopped 08/09/21 2107)  iohexol (OMNIPAQUE) 350 MG/ML injection 80 mL (80 mLs Intravenous Contrast Given 08/09/21 1944)    ED Course  I have reviewed the triage vital signs and the nursing notes.  Pertinent labs & imaging results that were available during my care of the patient were reviewed by me and considered in my medical decision making (see chart for details).    MDM Rules/Calculators/A&P                          Patient is a 45 year old woman who presents today for evaluation of abdominal pain. CBC and CMP are unremarkable.  Lipase is not elevated.  hCG is negative.  UA is without evidence of infection.  Vitals are reassuring, she is not hypoxic.  Lungs are clear to auscultation bilaterally.  She was treated with IV fluids, morphine, and Zofran with improvement in her symptoms. CT abdomen pelvis is obtained without acute abnormalities or cause for symptoms found.    EKG was obtained which was reassuring.  Recommended PCP follow-up.  I suspect that she has  musculoskeletal pain as her pain is relieved with a lidocaine patch.  She does not have a rash in the area and if she had shingles at this point I would have expected her to develop skin changes.    Return precautions were discussed with patient who states their understanding.  At the time of discharge patient denied any unaddressed complaints or concerns.  Patient is agreeable for discharge home.  Note: Portions of this report may have been transcribed using voice recognition software. Every effort was made to ensure accuracy; however, inadvertent computerized transcription errors may be present   Final Clinical Impression(s) / ED Diagnoses Final diagnoses:  Side pain    Rx / DC Orders ED Discharge Orders          Ordered    lidocaine (LIDODERM) 5 %  Every 24 hours        08/09/21 2053             Norman Clay 08/09/21 2218    Terrilee Files, MD 08/10/21 1109

## 2021-08-09 NOTE — Discharge Instructions (Addendum)
As we discussed today your blood work and your CAT scan were both reassuring. Please schedule a follow-up appointment with your primary care doctor. If you develop fevers, nausea/vomiting, you have a rash appear, or have any new or concerning symptoms please seek additional medical care and evaluation.

## 2021-08-09 NOTE — ED Provider Notes (Signed)
[facility]    CSN: [id number] Arrival date & time: 08/09/21  1207      History   Chief Complaint No chief complaint on file.   HPI Mary Oneill is a 45 y.o. female.   Patient presents with right sided abdominal pain that has been present for approximately 9 days.  Patient reports that pain has increased in intensity and has become "unbearable".  Pain is rated on a 10/10 on a pain scale.  Pain is described as stabbing and constant pain.  Denies nausea, vomiting, diarrhea, constipation, urinary burning, urinary frequency, irregular vaginal bleeding, vaginal discharge, hematuria, pelvic pain, back pain.  Pain is aggravated with lying flat.  Denies history of cholecystectomy or appendectomy.  Patient denies noticing any blood in the stool recently.  Having regular bowel movements.  Last bowel movement was this morning.  Denies chest pain or shortness of breath, although patient reports that it is hard to take a deep breath due to pain.  Denies fevers or known sick contacts.  Denies any recent injuries to the abdomen or back.    Past Medical History:  Diagnosis Date   Hypertension     There are no problems to display for this patient.   History reviewed. No pertinent surgical history.  OB History   No obstetric history on file.      Home Medications    Prior to Admission medications   Medication Sig Start Date End Date Taking? Authorizing Provider  diclofenac (VOLTAREN) 75 MG EC tablet Take 1 tablet (75 mg total) by mouth 2 (two) times daily. 04/08/21   Hudnall, Azucena Fallen, MD  hydrochlorothiazide (HYDRODIURIL) 25 MG tablet Take 25 mg by mouth daily.    [provider]  tiZANidine (ZANAFLEX) 4 MG tablet Take 1 tablet (4 mg total) by mouth every 8 (eight) hours as needed. 03/13/21   Wallis Bamberg, PA-C    Family History Family History  Problem Relation Age of Onset   Hypertension Mother     Social History Social History   Tobacco Use   Smoking status:  Every Day    Packs/day: 0.50    Types: Cigarettes   Smokeless tobacco: Never  Vaping Use   Vaping Use: Never used  Substance Use Topics   Alcohol use: Yes    Comment: socially   Drug use: No     Allergies   Patient has no known allergies.   Review of Systems Review of Systems Per HPI  Physical Exam Triage Vital Signs ED Triage Vitals  Enc Vitals Group     BP 08/09/21 1244 (!) 168/104     Pulse Rate 08/09/21 1244 83     Resp 08/09/21 1244 20     Temp 08/09/21 1244 98.3 F (36.8 C)     Temp Source 08/09/21 1244 Oral     SpO2 08/09/21 1244 98 %     Weight --      Height --      Head Circumference --      Peak Flow --      Pain Score 08/09/21 1249 10     Pain Loc --      Pain Edu? --      Excl. in GC? --    No data found.  Updated Vital Signs BP (!) 168/104 (BP Location: Left Arm)   Pulse 83   Temp 98.3 F (36.8 C) (Oral)   Resp 20   SpO2 98%   Visual Acuity Right Eye Distance:  Left Eye Distance:   Bilateral Distance:    Right Eye Near:   Left Eye Near:    Bilateral Near:     Physical Exam Constitutional:      Appearance: Normal appearance.     Comments: Patient is currently in tears due to pain.  HENT:     Head: Normocephalic and atraumatic.  Eyes:     Extraocular Movements: Extraocular movements intact.     Conjunctiva/sclera: Conjunctivae normal.  Cardiovascular:     Rate and Rhythm: Normal rate and regular rhythm.     Pulses: Normal pulses.     Heart sounds: Normal heart sounds.  Pulmonary:     Effort: Pulmonary effort is normal.  Abdominal:     General: Bowel sounds are normal. There is no distension.     Palpations: Abdomen is soft.     Tenderness: There is abdominal tenderness in the right upper quadrant and right lower quadrant. There is no right CVA tenderness or left CVA tenderness. Negative signs include Murphy's sign and McBurney's sign.  Skin:    General: Skin is warm and dry.  Neurological:     General: No focal deficit  present.     Mental Status: She is alert and oriented to person, place, and time. Mental status is at baseline.  Psychiatric:        Mood and Affect: Mood normal.        Behavior: Behavior normal.        Thought Content: Thought content normal.        Judgment: Judgment normal.     UC Treatments / Results  Labs (all labs ordered are listed, but only abnormal results are displayed) Labs Reviewed  POCT URINALYSIS DIP (MANUAL ENTRY) - Abnormal; Notable for the following components:      Result Value   Protein Ur, POC =30 (*)    All other components within normal limits    EKG   Radiology No results found.  Procedures Procedures (including critical care time)  Medications Ordered in UC Medications - No data to display  Initial Impression / Assessment and Plan / UC Course  I have reviewed the triage vital signs and the nursing notes.  Pertinent labs & imaging results that were available during my care of the patient were reviewed by me and considered in my medical decision making (see chart for details).     Advised patient that she will need to go to the hospital for further evaluation and management of severe abdominal pain.  Urinalysis not definitive for any urinary tract infection.  Patient will need further work-up due to severity of abdominal pain.  Vital signs stable on discharge.  Agree with patient self transport to the hospital.  Patient was agreeable with plan. Final Clinical Impressions(s) / UC Diagnoses   Final diagnoses:  Continuous severe abdominal pain     Discharge Instructions      Please go to the hospital as soon as you leave urgent care for further evaluation and management.     ED Prescriptions   None    PDMP not reviewed this encounter.   Lance Muss, FNP 08/09/21 1325

## 2021-08-09 NOTE — Discharge Instructions (Addendum)
Please go to the hospital as soon as you leave urgent care for further evaluation and management. 

## 2021-08-09 NOTE — ED Triage Notes (Signed)
Right sided flank pain beginning on 9/16, pain has become unbearable. Denies any changes to urine.

## 2021-08-09 NOTE — ED Triage Notes (Signed)
Patient here from Urgent Care reporting right sided abd pain radiating to right side flank that started 9 days ago with no relief.

## 2022-03-25 ENCOUNTER — Emergency Department (HOSPITAL_BASED_OUTPATIENT_CLINIC_OR_DEPARTMENT_OTHER)
Admission: EM | Admit: 2022-03-25 | Discharge: 2022-03-25 | Disposition: A | Discharge status: Home / Self Care | Payer: 59 | Attending: Emergency Medicine | Admitting: Emergency Medicine

## 2022-03-25 ENCOUNTER — Emergency Department (HOSPITAL_BASED_OUTPATIENT_CLINIC_OR_DEPARTMENT_OTHER): Payer: 59 | Admitting: Radiology

## 2022-03-25 ENCOUNTER — Ambulatory Visit
Admission: EM | Admit: 2022-03-25 | Discharge: 2022-03-25 | Disposition: A | Discharge status: Other Acute Inpatient Hospital | Payer: 59

## 2022-03-25 ENCOUNTER — Encounter (HOSPITAL_BASED_OUTPATIENT_CLINIC_OR_DEPARTMENT_OTHER): Payer: Self-pay

## 2022-03-25 ENCOUNTER — Other Ambulatory Visit: Payer: Self-pay

## 2022-03-25 DIAGNOSIS — S6992XA Unspecified injury of left wrist, hand and finger(s), initial encounter: Secondary | ICD-10-CM | POA: Diagnosis present

## 2022-03-25 DIAGNOSIS — M79645 Pain in left finger(s): Secondary | ICD-10-CM

## 2022-03-25 DIAGNOSIS — W268XXA Contact with other sharp object(s), not elsewhere classified, initial encounter: Secondary | ICD-10-CM | POA: Diagnosis not present

## 2022-03-25 DIAGNOSIS — S60012A Contusion of left thumb without damage to nail, initial encounter: Secondary | ICD-10-CM | POA: Insufficient documentation

## 2022-03-25 DIAGNOSIS — M7989 Other specified soft tissue disorders: Secondary | ICD-10-CM

## 2022-03-25 MED ORDER — DOXYCYCLINE HYCLATE 100 MG PO CAPS: 100.0000 mg | ORAL_CAPSULE | Freq: Two times a day (BID) | ORAL | 0 refills | Status: DC

## 2022-03-25 MED ORDER — DOXYCYCLINE HYCLATE 100 MG PO TABS
100.0000 mg | ORAL_TABLET | Freq: Once | ORAL | Status: AC
  Administered 2022-03-25: 100 mg via ORAL
  Filled 2022-03-25: qty 1

## 2022-03-25 MED ORDER — ACETAMINOPHEN 325 MG PO TABS
650.0000 mg | ORAL_TABLET | Freq: Once | ORAL | Status: AC
  Administered 2022-03-25: 650 mg via ORAL
  Filled 2022-03-25: qty 2

## 2022-03-25 NOTE — Discharge Instructions (Signed)
Take next dose antibiotic tomorrow.  Recommend Tylenol and ibuprofen and ice.  I suspect that this is likely more arthritis than infection.  However if you develop worsening swelling, redness, purulent drainage please return for evaluation.  Follow-up with your primary care doctor. ?

## 2022-03-25 NOTE — ED Notes (Signed)
Left thumb is swollen and painful to touch ? ?

## 2022-03-25 NOTE — ED Notes (Signed)
Patient is being discharged from the Urgent Care and sent to the Emergency Department via private vehicle . Per Sherryle Lis patient is in need of higher level of care due to further evaluation. Patient is aware and verbalizes understanding of plan of care.  ?Vitals:  ? 03/25/22 1824  ?BP: 99/70  ?Pulse: 84  ?Resp: 18  ?Temp: 98.1 ?F (36.7 ?C)  ?SpO2: 99%  ?  ?

## 2022-03-25 NOTE — ED Provider Notes (Signed)
?[facility] EMERGENCY DEPT ?Provider Note ? ? ?CSN: [id number] ?Arrival date & time: 03/25/22  1912 ? ?  ? ?History ? ?Chief Complaint  ?Patient presents with  ? Hand Problem  ? ? ?Mary Oneill is a 46 y.o. female. ? ?Patient here with left thumb pain.  Prior injury to left thumb.  Pain and swelling over the last several days.  Had a small paper cut at the base of the thumb at that time.  Denies any trauma.  No fevers no chills.  Nothing has made it worse or better. ? ? ? ?  ? ?Home Medications ?Prior to Admission medications   ?Medication Sig Start Date End Date Taking? Authorizing Provider  ?doxycycline (VIBRAMYCIN) 100 MG capsule Take 1 capsule (100 mg total) by mouth 2 (two) times daily. 03/25/22  Yes Krysia Zahradnik, DO  ?diclofenac (VOLTAREN) 75 MG EC tablet Take 1 tablet (75 mg total) by mouth 2 (two) times daily. 04/08/21   Lenda Kelp, MD  ?hydrochlorothiazide (HYDRODIURIL) 25 MG tablet Take 25 mg by mouth daily.    [provider]  ?lidocaine (LIDODERM) 5 % Place 1 patch onto the skin daily. After 12 hours remove patch and leave off for 12 hours before placing new patch. 08/09/21   Cristina Gong, PA-C  ?tiZANidine (ZANAFLEX) 4 MG tablet Take 1 tablet (4 mg total) by mouth every 8 (eight) hours as needed. 03/13/21   Wallis Bamberg, PA-C  ?   ? ?Allergies    ?Patient has no known allergies.   ? ?Review of Systems   ?Review of Systems ? ?Physical Exam ?Updated Vital Signs ?BP 129/85 (BP Location: Right Arm)   Pulse 70   Temp 99 ?F (37.2 ?C)   Resp 18   Ht 5\' 4"  (1.626 m)   Wt 75.8 kg   SpO2 100%   BMI 28.67 kg/m?  ?Physical Exam ?Constitutional:   ?   General: She is not in acute distress. ?   Appearance: She is not ill-appearing.  ?Cardiovascular:  ?   Pulses: Normal pulses.  ?Musculoskeletal:     ?   General: Tenderness present. Normal range of motion.  ?   Comments: Mild tenderness at the base of the left thumb, small abrasion at the base of the thumb with very minimal  swelling, no major pain with range of motion of the thumb or wrist  ?Skin: ?   General: Skin is warm.  ?   Findings: No erythema.  ?Neurological:  ?   General: No focal deficit present.  ?   Mental Status: She is alert.  ?   Sensory: No sensory deficit.  ?   Motor: No weakness.  ? ? ?ED Results / Procedures / Treatments   ?Labs ?(all labs ordered are listed, but only abnormal results are displayed) ?Labs Reviewed - No data to display ? ?EKG ?None ? ?Radiology ?DG Hand Complete Left ? ?Result Date: 03/25/2022 ?CLINICAL DATA:  Left hand pain, thumb pain EXAM: LEFT HAND - COMPLETE 3+ VIEW COMPARISON:  None Available. FINDINGS: There is no evidence of fracture or dislocation. There is no evidence of arthropathy or other focal bone abnormality. Soft tissues are unremarkable. IMPRESSION: Negative. Electronically Signed   By: 05/25/2022 M.D.   On: 03/25/2022 20:54   ? ?Procedures ?Procedures  ? ? ?Medications Ordered in ED ?Medications  ?acetaminophen (TYLENOL) tablet 650 mg (has no administration in time range)  ?doxycycline (VIBRA-TABS) tablet 100 mg (has no administration in time range)  ? ? ?  ED Course/ Medical Decision Making/ A&P ?  ?                        ?Medical Decision Making ?Amount and/or Complexity of Data Reviewed ?Radiology: ordered. ? ?Risk ?OTC drugs. ? ? ?Mary Oneill is here with left thumb pain.  X-ray per the radiologist read shows no fracture, no major swelling.  She has a small paper cut or abrasion at the base of the left thumb.  May be mild infectious process or arthritis process going on.  We will treat conservatively with antibiotics.  At no concern for septic joint.  Recommend Tylenol and ibuprofen and ice as well.  Neurovascular neuromuscular intact.  Discharged in good condition. ? ?This chart was dictated using voice recognition software.  Despite best efforts to proofread,  errors can occur which can change the documentation meaning.  ? ? ? ? ? ? ? ?Final Clinical Impression(s) / ED  Diagnoses ?Final diagnoses:  ?Pain of left thumb  ? ? ?Rx / DC Orders ?ED Discharge Orders   ? ?      Ordered  ?  doxycycline (VIBRAMYCIN) 100 MG capsule  2 times daily       ? 03/25/22 2107  ? ?  ?  ? ?  ? ? ?  ?Virgina Norfolk, DO ?03/25/22 2108 ? ?

## 2022-03-25 NOTE — Discharge Instructions (Signed)
I'm worried you have a severe thumb infection. You need imaging and possibly IV antibiotics or drainage of the thumb. Please head straight there.  ?

## 2022-03-25 NOTE — ED Triage Notes (Signed)
Patient presents to Urgent Care with complaints of L thumb skin swelling and pain since monday. Patient reports she is not sure if she had a splinter in it, injured it but she did have a tiny scab with puss  on tuesday.  ? ?

## 2022-03-25 NOTE — ED Triage Notes (Signed)
Pt presents with swelling and pain to her L thumb that has been worsening for 2 days. Pt reports she initially had small paper cut to the base of the thumb.  ?

## 2022-03-25 NOTE — ED Provider Notes (Signed)
?[facility] ? ? ? ?CSN: [id number] ?Arrival date & time: 03/25/22  1810 ? ? ?  ? ?History   ?Chief Complaint ?Chief Complaint  ?Patient presents with  ? thumb swelling  ? ? ?HPI ?Mary Oneill is a 46 y.o. female presenting with left thumb pain.  History of avulsion fracture of left thumb 2020.  Describes progressively worsening swelling and tenderness over the left thumb for about 10 days.  Denies known trauma, but she is concerned about a small punctum at the base of the thumb from which pus and blood is draining.  States the swelling is actually somewhat improved from yesterday.  Significant pain with bending the thumb.  Pain is concentrated over the first MCP joint.  Denies known trauma. ? ?HPI ? ?Past Medical History:  ?Diagnosis Date  ? Hypertension   ? ? ?There are no problems to display for this patient. ? ? ?No past surgical history on file. ? ?OB History   ?No obstetric history on file. ?  ? ? ? ?Home Medications   ? ?Prior to Admission medications   ?Medication Sig Start Date End Date Taking? Authorizing Provider  ?diclofenac (VOLTAREN) 75 MG EC tablet Take 1 tablet (75 mg total) by mouth 2 (two) times daily. 04/08/21   Lenda Kelp, MD  ?hydrochlorothiazide (HYDRODIURIL) 25 MG tablet Take 25 mg by mouth daily.    [provider]  ?lidocaine (LIDODERM) 5 % Place 1 patch onto the skin daily. After 12 hours remove patch and leave off for 12 hours before placing new patch. 08/09/21   Cristina Gong, PA-C  ?tiZANidine (ZANAFLEX) 4 MG tablet Take 1 tablet (4 mg total) by mouth every 8 (eight) hours as needed. 03/13/21   Wallis Bamberg, PA-C  ? ? ?Family History ?Family History  ?Problem Relation Age of Onset  ? Hypertension Mother   ? ? ?Social History ?Social History  ? ?Tobacco Use  ? Smoking status: Every Day  ?  Packs/day: 0.50  ?  Types: Cigarettes  ? Smokeless tobacco: Never  ?Vaping Use  ? Vaping Use: Never used  ?Substance Use Topics  ? Alcohol use: Yes  ?  Comment: socially  ?  Drug use: No  ? ? ? ?Allergies   ?Patient has no known allergies. ? ? ?Review of Systems ?Review of Systems  ?Musculoskeletal:   ?     L thumb pain and swelling   ?All other systems reviewed and are negative. ? ? ?Physical Exam ?Triage Vital Signs ?ED Triage Vitals  ?Enc Vitals Group  ?   BP   ?   Pulse   ?   Resp   ?   Temp   ?   Temp src   ?   SpO2   ?   Weight   ?   Height   ?   Head Circumference   ?   Peak Flow   ?   Pain Score   ?   Pain Loc   ?   Pain Edu?   ?   Excl. in GC?   ? ?No data found. ? ?Updated Vital Signs ?BP 99/70 (BP Location: Right Arm)   Pulse 84   Temp 98.1 ?F (36.7 ?C) (Oral)   Resp 18   SpO2 99%  ? ?Visual Acuity ?Right Eye Distance:   ?Left Eye Distance:   ?Bilateral Distance:   ? ?Right Eye Near:   ?Left Eye Near:    ?Bilateral Near:    ? ?  Physical Exam ?Vitals reviewed.  ?Constitutional:   ?   General: She is not in acute distress. ?   Appearance: Normal appearance. She is not ill-appearing.  ?HENT:  ?   Head: Normocephalic and atraumatic.  ?Pulmonary:  ?   Effort: Pulmonary effort is normal.  ?Skin: ?   Comments: L thumb is diffusely swollen, worst over the 1st MCP joint. ROM significantly limited due to swelling and pain.   ?Neurological:  ?   General: No focal deficit present.  ?   Mental Status: She is alert and oriented to person, place, and time.  ?Psychiatric:     ?   Mood and Affect: Mood normal.     ?   Behavior: Behavior normal.     ?   Thought Content: Thought content normal.     ?   Judgment: Judgment normal.  ? ? ? ?UC Treatments / Results  ?Labs ?(all labs ordered are listed, but only abnormal results are displayed) ?Labs Reviewed - No data to display ? ?EKG ? ? ?Radiology ?No results found. ? ?Procedures ?Procedures (including critical care time) ? ?Medications Ordered in UC ?Medications - No data to display ? ?Initial Impression / Assessment and Plan / UC Course  ?I have reviewed the triage vital signs and the nursing notes. ? ?Pertinent labs & imaging results that  were available during my care of the patient were reviewed by me and considered in my medical decision making (see chart for details). ? ?  ? ?This patient is a very pleasant 46 y.o. year old female presenting with L thumb infection x1 week. Afebrile, nontachy. No known trauma. History of avulsion fracture of left thumb 2020. I have concern for flexor tenosynovitis versus septic joint.  We do not have x-ray imaging at this location today, and I am not comfortable with outpatient imaging given severity of condition.  Sent to the emergency department via POV, she is in agreement. ? ? ?Final Clinical Impressions(s) / UC Diagnoses  ? ?Final diagnoses:  ?Swelling of left thumb  ? ? ? ?Discharge Instructions   ? ?  ?I'm worried you have a severe thumb infection. You need imaging and possibly IV antibiotics or drainage of the thumb. Please head straight there.  ? ? ? ? ?ED Prescriptions   ?None ?  ? ?PDMP not reviewed this encounter. ?  ?Rhys Martini, PA-C ?03/25/22 1902 ? ?

## 2022-04-02 ENCOUNTER — Ambulatory Visit (INDEPENDENT_AMBULATORY_CARE_PROVIDER_SITE_OTHER): Payer: 59

## 2022-04-02 ENCOUNTER — Ambulatory Visit (HOSPITAL_COMMUNITY)
Admission: EM | Admit: 2022-04-02 | Discharge: 2022-04-02 | Disposition: A | Discharge status: Home / Self Care | Payer: 59 | Attending: Physician Assistant | Admitting: Physician Assistant

## 2022-04-02 ENCOUNTER — Encounter (HOSPITAL_COMMUNITY): Payer: Self-pay

## 2022-04-02 DIAGNOSIS — M79645 Pain in left finger(s): Secondary | ICD-10-CM

## 2022-04-02 DIAGNOSIS — S61002D Unspecified open wound of left thumb without damage to nail, subsequent encounter: Secondary | ICD-10-CM

## 2022-04-02 MED ORDER — CEFTRIAXONE SODIUM 1 G IJ SOLR
1.0000 g | Freq: Once | INTRAMUSCULAR | Status: AC
  Administered 2022-04-02: 1 g via INTRAMUSCULAR

## 2022-04-02 MED ORDER — LIDOCAINE HCL (PF) 1 % IJ SOLN
INTRAMUSCULAR | Status: AC
  Filled 2022-04-02: qty 2

## 2022-04-02 MED ORDER — SULFAMETHOXAZOLE-TRIMETHOPRIM 800-160 MG PO TABS: 1.0000 | ORAL_TABLET | Freq: Two times a day (BID) | ORAL | 0 refills | Status: AC

## 2022-04-02 MED ORDER — CEFTRIAXONE SODIUM 1 G IJ SOLR
INTRAMUSCULAR | Status: AC
  Filled 2022-04-02: qty 10

## 2022-04-02 NOTE — ED Triage Notes (Signed)
Last seen on 5/11 for swelling in L thumb. Pt presents today noting 2 days ago that her thumb started to drain. Has been taking doxycycline. Pt feels like there is a foreign body inside of the cut.

## 2022-04-02 NOTE — Discharge Instructions (Signed)
Your x-ray was normal.  Given you have ongoing symptoms I think it is important that you follow-up with a hand specialist.  Please call to schedule an appointment them first thing Monday morning.  Stop doxycycline and start Bactrim DS.  If you have any rash or oral lesions stop the medication to be seen immediately.  Keep this area clean.  If anything worsens and you have increased swelling, fever, nausea/vomiting, numbness or tingling in your hand, difficulty moving your thumb you need to go to the emergency room immediately as we discussed.

## 2022-04-02 NOTE — ED Provider Notes (Signed)
[facility]    CSN: [id number] Arrival date & time: 04/02/22  1524      History   Chief Complaint Chief Complaint  Patient presents with   Hand Pain    L thumb    HPI Mary Oneill is a 46 y.o. female.   Patient presents today with a several week history of left thumb pain.  Reports that a few weeks ago around 03/24/2022 she was at the park with her daughter when she noticed a small wound at the base of her left thumb.  She woke up the next day with significant pain and swelling of her left thumb.  She was seen by our clinic 03/25/2022 and encouraged to go to the emergency room for further evaluation and management.  [facility] at which point x-ray was normal and she was started on doxycycline empirically to cover for infection but there was no concern for septic joint.  She has been taking antibiotics as prescribed and has noticed a swelling and improvement of symptoms but continues to have a wound with intermittent drainage.  Pain is rated 8 on a 0-10 pain scale, localized to left thumb, described as throbbing, no aggravating alleviating factors identified.  She denies any weakness, numbness, paresthesias.  She is left-handed.   Past Medical History:  Diagnosis Date   Hypertension     There are no problems to display for this patient.   History reviewed. No pertinent surgical history.  OB History   No obstetric history on file.      Home Medications    Prior to Admission medications   Medication Sig Start Date End Date Taking? Authorizing Provider  sulfamethoxazole-trimethoprim (BACTRIM DS) 800-160 MG tablet Take 1 tablet by mouth 2 (two) times daily for 7 days. 04/02/22 04/09/22 Yes Consetta Cosner K, PA-C  hydrochlorothiazide (HYDRODIURIL) 25 MG tablet Take 25 mg by mouth daily.    [provider]  lidocaine (LIDODERM) 5 % Place 1 patch onto the skin daily. After 12 hours remove patch and leave off for 12 hours before placing new patch.  08/09/21   Cristina GongHammond, Elizabeth W, PA-C    Family History Family History  Problem Relation Age of Onset   Hypertension Mother     Social History Social History   Tobacco Use   Smoking status: Every Day    Packs/day: 0.50    Types: Cigarettes   Smokeless tobacco: Never  Vaping Use   Vaping Use: Never used  Substance Use Topics   Alcohol use: Yes    Comment: socially   Drug use: Never     Allergies   Patient has no known allergies.   Review of Systems Review of Systems  Constitutional:  Positive for activity change. Negative for appetite change, fatigue and fever.  Musculoskeletal:  Positive for arthralgias. Negative for joint swelling and myalgias.  Skin:  Positive for wound. Negative for color change.  Neurological:  Negative for dizziness, weakness, light-headedness, numbness and headaches.    Physical Exam Triage Vital Signs ED Triage Vitals [04/02/22 1554]  Enc Vitals Group     BP (!) 149/73     Pulse Rate 71     Resp 18     Temp 99.1 F (37.3 C)     Temp Source Oral     SpO2 99 %     Weight      Height      Head Circumference      Peak Flow  Pain Score 8     Pain Loc      Pain Edu?      Excl. in GC?    No data found.  Updated Vital Signs BP (!) 149/73 (BP Location: Left Arm)   Pulse 71   Temp 99.1 F (37.3 C) (Oral)   Resp 18   SpO2 99%   Visual Acuity Right Eye Distance:   Left Eye Distance:   Bilateral Distance:    Right Eye Near:   Left Eye Near:    Bilateral Near:     Physical Exam Vitals reviewed.  Constitutional:      General: She is awake. She is not in acute distress.    Appearance: Normal appearance. She is well-developed. She is not ill-appearing.     Comments: Very pleasant female appears stated age in no acute distress sitting comfortably in exam room  HENT:     Head: Normocephalic and atraumatic.  Cardiovascular:     Rate and Rhythm: Normal rate and regular rhythm.     Pulses:          Radial pulses are 2+ on  the right side and 2+ on the left side.     Heart sounds: Normal heart sounds, S1 normal and S2 normal. No murmur heard.    Comments: Capillary refill within 2 seconds left hand Pulmonary:     Effort: Pulmonary effort is normal.     Breath sounds: Normal breath sounds. No wheezing, rhonchi or rales.     Comments: Clear to auscultation bilaterally Musculoskeletal:     Left hand: Swelling and tenderness present. No lacerations or bony tenderness. Normal range of motion. There is no disruption of two-point discrimination. Normal capillary refill.     Comments: Left hand: Wound noted at base of left thumb with serosanguineous drainage.  Normal active range of motion of left thumb and other phalanges.  Hand is neurovascularly intact.  Psychiatric:        Behavior: Behavior is cooperative.      UC Treatments / Results  Labs (all labs ordered are listed, but only abnormal results are displayed) Labs Reviewed - No data to display  EKG   Radiology DG Finger Thumb Left  Result Date: 04/02/2022 CLINICAL DATA:  Persistent pain and swelling left thumb. Patient states drainage from left thumb beginning 2 days ago. Patient on doxycycline. Patient feels like foreign body present. EXAM: LEFT THUMB 2+V COMPARISON:  03/25/2022 FINDINGS: No evidence of fracture or dislocation. No evidence of foreign body. No air within the soft tissues. Mild early degenerative change over the first carpometacarpal joint and first MCP joint. IMPRESSION: 1. No acute findings. No foreign body. 2. Mild early degenerative changes. Electronically Signed   By: Elberta Fortis M.D.   On: 04/02/2022 16:35    Procedures Procedures (including critical care time)  Medications Ordered in UC Medications  cefTRIAXone (ROCEPHIN) injection 1 g (has no administration in time range)    Initial Impression / Assessment and Plan / UC Course  I have reviewed the triage vital signs and the nursing notes.  Pertinent labs & imaging results  that were available during my care of the patient were reviewed by me and considered in my medical decision making (see chart for details).     X-ray repeated given persistent symptoms which showed no osseous abnormality or retained foreign body.  Patient is afebrile, nontoxic, nontachycardic, well-appearing.  Hand is neurovascularly intact.  She does report improvement with antibiotics so will switch coverage  to Rocephin in clinic and start Bactrim DS outpatient.  Discussed that given ongoing symptoms she should follow-up with hand specialist and was given contact information for local provider with instruction to call to schedule an appointment as soon as possible.  She can use Tylenol and ibuprofen for pain.  Discussed that if she has any worsening symptoms including increased pain, change in character of drainage, difficulty moving her thumb, numbness, paresthesias, fever, nausea, vomiting she needs to go to the emergency room immediately to which she expressed understanding.  Final Clinical Impressions(s) / UC Diagnoses   Final diagnoses:  Thumb pain, left  Open wound of left thumb without complication, subsequent encounter     Discharge Instructions      Your x-ray was normal.  Given you have ongoing symptoms I think it is important that you follow-up with a hand specialist.  Please call to schedule an appointment them first thing Monday morning.  Stop doxycycline and start Bactrim DS.  If you have any rash or oral lesions stop the medication to be seen immediately.  Keep this area clean.  If anything worsens and you have increased swelling, fever, nausea/vomiting, numbness or tingling in your hand, difficulty moving your thumb you need to go to the emergency room immediately as we discussed.     ED Prescriptions     Medication Sig Dispense Auth. Provider   sulfamethoxazole-trimethoprim (BACTRIM DS) 800-160 MG tablet Take 1 tablet by mouth 2 (two) times daily for 7 days. 14 tablet  Alethea Terhaar, Noberto Retort, PA-C      PDMP not reviewed this encounter.   Jeani Hawking, PA-C 04/02/22 1651

## 2022-04-13 ENCOUNTER — Ambulatory Visit (INDEPENDENT_AMBULATORY_CARE_PROVIDER_SITE_OTHER): Payer: 59 | Admitting: Orthopedic Surgery

## 2022-04-13 ENCOUNTER — Encounter: Payer: Self-pay | Admitting: Orthopedic Surgery

## 2022-04-13 ENCOUNTER — Encounter (HOSPITAL_COMMUNITY): Payer: Self-pay | Admitting: Orthopedic Surgery

## 2022-04-13 DIAGNOSIS — S61012A Laceration without foreign body of left thumb without damage to nail, initial encounter: Secondary | ICD-10-CM

## 2022-04-13 DIAGNOSIS — L089 Local infection of the skin and subcutaneous tissue, unspecified: Secondary | ICD-10-CM | POA: Diagnosis not present

## 2022-04-13 NOTE — Progress Notes (Signed)
  [facility] -  COVID TEST-   Anesthesia review: n/a  -------------  SDW INSTRUCTIONS:  Your procedure is scheduled on 5/31. Please report to [facility] Main Entrance "A" at 4 P.M., and check in at the Admitting office. Call this number if you have problems the morning of surgery: [phone]   Remember: Do not eat after 6am before your surgery  You may drink clear liquids until 3pm day of your surgery.   Clear liquids allowed are: Water, Non-Citrus Juices (without pulp), Carbonated Beverages, Clear Tea, Black Coffee Only, and Gatorade   Medications to take morning of surgery with a sip of water include: None   As of today, STOP taking any Aspirin (unless otherwise instructed by your surgeon), Aleve, Naproxen, Ibuprofen, Motrin, Advil, Goody's, BC's, all herbal medications, fish oil, and all vitamins.    The Morning of Surgery Do not wear jewelry, make-up or nail polish. Do not wear lotions, powders, or perfumes, or deodorant Do not bring valuables to the hospital. [facility] is not responsible for any belongings or valuables.  If you are a smoker, DO NOT Smoke 24 hours prior to surgery  If you wear a CPAP at night please bring your mask the morning of surgery   Remember that you must have someone to transport you home after your surgery, and remain with you for 24 hours if you are discharged the same day.  Please bring cases for contacts, glasses, hearing aids, dentures or bridgework because it cannot be worn into surgery.   Patients discharged the day of surgery will not be allowed to drive home.   Please shower the NIGHT BEFORE/MORNING OF SURGERY (use antibacterial soap like DIAL soap if possible). Wear comfortable clothes the morning of surgery. Oral Hygiene is also important to reduce your risk of infection.  Remember - BRUSH YOUR TEETH THE MORNING OF SURGERY WITH YOUR REGULAR TOOTHPASTE  Patient denies shortness of breath, fever, cough and chest pain.

## 2022-04-13 NOTE — Progress Notes (Signed)
Office Visit Note   Patient: Mary Oneill           Date of Birth: 01/21/1976           MRN: [id number] Visit Date: 04/13/2022              Requested by: [facility] [address] [place],  [place] [address] PCP: [facility]   Assessment & Plan: Visit Diagnoses:  1. Laceration of left thumb with infection, initial encounter     Plan: Patient has what seems to be a superficial infection of the volar aspect of the thumb over the MCP joint.  She has no pain with range of motion of the IP or MCP joints which is reassuring that this does not involve the flexor tendon sheath.  There is expressible purulence from the healing wound.  We discussed I&D in the office versus in the operating room.  We will plan is not bringing room tomorrow.  We discussed the risk of surgery including bleeding, continued infection, need for additional surgery, damage to the neurovascular bundles.  She would like to proceed with surgery.  Follow-Up Instructions: No follow-ups on file.   Orders:  No orders of the defined types were placed in this encounter.  No orders of the defined types were placed in this encounter.     Procedures: No procedures performed   Clinical Data: No additional findings.   Subjective: Chief Complaint  Patient presents with   Left Thumb - Pain, Injury    DOI: 03/20/22, was at the park with her daughter and hit it, was a splinter in it at the time. + swelling in the beginning, LEFT Handed, PAIN: 6/10    This is a 46 year old left-hand-dominant female who works as a Conservation officer, nature at [employer] and presents with pain at the volar aspect of the left thumb.  The started around 03/20/2022, however, she does not remember how she injured her thumb.  She thinks maybe she had a splinter in the area.  Since then she had pain at the volar aspect of the thumb overlying the MCP flexion crease.  Has been seen in the ER for this twice and was started on both  doxycycline and Bactrim.  She denies pain elsewhere other than at the MP flexion crease.  She has no pain at the IP joint or the [facility] joint.  He has no pain in the thenar eminence.  He has no pain to dorsal aspect of the thumb.  She denies any systemic symptoms.  Injury   Review of Systems   Objective: Vital Signs: BP 122/81 (BP Location: Left Arm, Patient Position: Sitting)   Pulse 90   Ht 5\' 4"  (1.626 m)   Wt 167 lb 1.7 oz (75.8 kg)   BMI 28.68 kg/m   Physical Exam Constitutional:      Appearance: Normal appearance.  Cardiovascular:     Rate and Rhythm: Normal rate.     Pulses: Normal pulses.  Pulmonary:     Effort: Pulmonary effort is normal.  Skin:    General: Skin is warm and dry.     Capillary Refill: Capillary refill takes less than 2 seconds.  Neurological:     Mental Status: She is alert.    Left Hand Exam   Tenderness  Left hand tenderness location: TTP at MCP flexion crease in area of small open wound.   Other  Erythema: absent Sensation: normal Pulse: present  Comments:  No pain w/ P/AROM  of thumb IP or MCP joints.  No TTP at thenar eminence or along dordal aspect of thumb.  Scant purulent drainage from small volar wound.      Specialty Comments:  No specialty comments available.  Imaging: No results found.   PMFS History: Patient Active Problem List   Diagnosis Date Noted   Laceration of left thumb with infection 04/13/2022   Past Medical History:  Diagnosis Date   Hypertension     Family History  Problem Relation Age of Onset   Hypertension Mother     No past surgical history on file. Social History   Occupational History   Not on file  Tobacco Use   Smoking status: Every Day    Packs/day: 0.50    Types: Cigarettes   Smokeless tobacco: Never  Vaping Use   Vaping Use: Never used  Substance and Sexual Activity   Alcohol use: Yes    Comment: socially   Drug use: Never   Sexual activity: Not Currently

## 2022-04-14 ENCOUNTER — Ambulatory Visit (HOSPITAL_COMMUNITY)
Admission: RE | Admit: 2022-04-14 | Discharge: 2022-04-14 | Disposition: A | Discharge status: Home / Self Care | Payer: 59 | Attending: Orthopedic Surgery | Admitting: Orthopedic Surgery

## 2022-04-14 ENCOUNTER — Encounter (HOSPITAL_COMMUNITY)
Admission: RE | Disposition: A | Discharge status: Home / Self Care | Payer: Self-pay | Source: Home / Self Care | Attending: Orthopedic Surgery

## 2022-04-14 ENCOUNTER — Encounter (HOSPITAL_COMMUNITY): Payer: Self-pay | Admitting: Orthopedic Surgery

## 2022-04-14 ENCOUNTER — Other Ambulatory Visit: Payer: Self-pay

## 2022-04-14 ENCOUNTER — Encounter (HOSPITAL_COMMUNITY): Payer: Self-pay | Admitting: Anesthesiology

## 2022-04-14 DIAGNOSIS — Z5309 Procedure and treatment not carried out because of other contraindication: Secondary | ICD-10-CM | POA: Diagnosis present

## 2022-04-14 LAB — BASIC METABOLIC PANEL
Anion gap: 9 (ref 5–15)
BUN: 11 mg/dL (ref 6–20)
CO2: 18 mmol/L — ABNORMAL LOW (ref 22–32)
Calcium: 9.5 mg/dL (ref 8.9–10.3)
Chloride: 109 mmol/L (ref 98–111)
Creatinine, Ser: 0.83 mg/dL (ref 0.44–1.00)
GFR, Estimated: >60 mL/min (ref >60)
Glucose, Bld: 86 mg/dL (ref 70–99)
Potassium: 3.6 mmol/L (ref 3.5–5.1)
Sodium: 136 mmol/L (ref 135–145)

## 2022-04-14 SURGERY — IRRIGATION AND DEBRIDEMENT EXTREMITY
Anesthesia: Choice | Site: Thumb | Laterality: Left

## 2022-04-14 MED ORDER — CHLORHEXIDINE GLUCONATE 0.12 % MT SOLN: 15.0000 mL | Freq: Once | OROMUCOSAL | Status: DC

## 2022-04-14 MED ORDER — ORAL CARE MOUTH RINSE: 15.0000 mL | Freq: Once | OROMUCOSAL | Status: DC

## 2022-04-14 MED ORDER — CHLORHEXIDINE GLUCONATE 0.12 % MT SOLN
OROMUCOSAL | Status: AC
  Filled 2022-04-14: qty 15

## 2022-04-14 MED ORDER — LACTATED RINGERS IV SOLN: INTRAVENOUS | Status: DC

## 2022-04-14 NOTE — Progress Notes (Signed)
Patient arrived in short stay; alert and oriented x 4. VS stable; denied pain or other  discomfort. Patient verbalized that she doesn't have anybody to take her home after surgery, or to stay with her for 24hrs after surgery; also, patient refused to stay in the hospital overnight, saying that she must be at work tomorrow morning. Patient verbalized that she had a bite of potatoes at 15:00 o'clock. Dr. Frazier Butt was notified. Dr. Frazier Butt canceled the surgery and ask the patient to come to his office tomorrow after 4 PM. Patient verbalized understanding.  IV D/C and patient was accompanied to the main entrance by staff.

## 2022-04-15 ENCOUNTER — Ambulatory Visit (INDEPENDENT_AMBULATORY_CARE_PROVIDER_SITE_OTHER): Payer: 59 | Admitting: Orthopedic Surgery

## 2022-04-15 DIAGNOSIS — S61012A Laceration without foreign body of left thumb without damage to nail, initial encounter: Secondary | ICD-10-CM | POA: Diagnosis not present

## 2022-04-15 DIAGNOSIS — S60359A Superficial foreign body of unspecified thumb, initial encounter: Secondary | ICD-10-CM | POA: Diagnosis not present

## 2022-04-15 DIAGNOSIS — L089 Local infection of the skin and subcutaneous tissue, unspecified: Secondary | ICD-10-CM | POA: Diagnosis not present

## 2022-04-15 NOTE — Progress Notes (Signed)
Office Visit Note   Patient: Mary Oneill           Date of Birth: 19-Nov-1975           MRN: [id number] Visit Date: 04/15/2022              Requested by: Pa, [facility] [address] [place],  [place] [address] PCP: Pa, [facility]   Assessment & Plan: Visit Diagnoses:  1. Laceration of left thumb with infection, initial encounter   2. Wood splinter in thumb     Plan:  Patient presents today for I&D of her left thumb infection.  See "procedure" portion of note for details.  Large, 1.5-2 cm splinter removed from volar aspect of thumb.  Wound thoroughly irrigated.  Minimal purulence present.  Patient to start performing daily wound care tomorrow. I can see her back next week for a wound check.   Follow-Up Instructions: No follow-ups on file.   Orders:  No orders of the defined types were placed in this encounter.  No orders of the defined types were placed in this encounter.     Procedures: Incision & Drainage  Date/Time: 04/15/2022 5:50 PM Performed by: Sherilyn Cooter, MD Authorized by: Sherilyn Cooter, MD   Consent:    Consent obtained:  Verbal   Consent given by:  Patient   Risks, benefits, and alternatives were discussed: yes     Risks discussed:  Bleeding, incomplete drainage, pain and infection   Alternatives discussed:  No treatment Location:    Type:  Abscess   Location:  Upper extremity   Upper extremity location:  Finger   Finger location:  L thumb Pre-procedure details:    Skin preparation:  Chlorhexidine Sedation:    Sedation type:  None Anesthesia:    Anesthesia method:  Local infiltration   Local anesthetic:  Lidocaine 1% w/o epi Procedure type:    Complexity:  Simple Procedure details:    Needle aspiration: no     Incision types:  Single straight   Incision depth:  Subcutaneous   Scalpel blade:  15   Wound management:  Probed and deloculated and irrigated with saline   Drainage:  Bloody   Drainage amount:   Scant   Wound treatment:  Wound left open   Packing materials:  None Post-procedure details:    Procedure completion:  Tolerated well, no immediate complications Comments:     Digital block performed with 1% plain lidocaine.  Approx 1 cm transverse incision made over healing wound. Large, 1.5 - 2 cm splinter found in volar thumb.  Minimal purulence.  Wound thoroughly irrigated and left open.  Dressed with bacitracin, 4x4, cling wrap, and ace wrap.    Clinical Data: No additional findings.   Subjective: No chief complaint on file.   This is a 46 year old left-hand-dominant female who works as a Scientist, water quality at [employer] and presents for I&D of the volar aspect of the left thumb.  She was seen in the office this week.  She continues to have pain at the volar aspect of the thumb around the level of the MCP joint w/ significant TTP.  There is scant drainage.    Review of Systems   Objective: Vital Signs: There were no vitals taken for this visit.  Physical Exam Constitutional:      Appearance: Normal appearance.  Cardiovascular:     Rate and Rhythm: Normal rate.     Pulses: Normal pulses.  Pulmonary:     Effort: Pulmonary effort  is normal.  Skin:    General: Skin is warm and dry.     Capillary Refill: Capillary refill takes less than 2 seconds.  Neurological:     Mental Status: She is alert.    Left Hand Exam   Tenderness  Left hand tenderness location: Significant tenderness to palpation at volar aspect of thumb. No erythema.  Minimal swelling.   Other  Erythema: absent Sensation: normal Pulse: present  Comments:  Scant drainage from healing wound.      Specialty Comments:  No specialty comments available.  Imaging: No results found.   PMFS History: Patient Active Problem List   Diagnosis Date Noted   Wood splinter in thumb 04/15/2022   Laceration of left thumb with infection 04/13/2022   Past Medical History:  Diagnosis Date   Hypertension     Family  History  Problem Relation Age of Onset   Hypertension Mother     No past surgical history on file. Social History   Occupational History   Not on file  Tobacco Use   Smoking status: Every Day    Packs/day: 0.50    Types: Cigarettes   Smokeless tobacco: Never  Vaping Use   Vaping Use: Never used  Substance and Sexual Activity   Alcohol use: Yes    Comment: socially   Drug use: Never   Sexual activity: Not Currently

## 2022-04-23 ENCOUNTER — Ambulatory Visit (INDEPENDENT_AMBULATORY_CARE_PROVIDER_SITE_OTHER): Payer: 59 | Admitting: Orthopedic Surgery

## 2022-04-23 DIAGNOSIS — L089 Local infection of the skin and subcutaneous tissue, unspecified: Secondary | ICD-10-CM

## 2022-04-23 DIAGNOSIS — S61012A Laceration without foreign body of left thumb without damage to nail, initial encounter: Secondary | ICD-10-CM

## 2022-04-23 DIAGNOSIS — S60359A Superficial foreign body of unspecified thumb, initial encounter: Secondary | ICD-10-CM

## 2022-04-23 NOTE — Progress Notes (Signed)
   Post-Op Visit Note   Patient: Mary Oneill           Date of Birth: September 03, 1976           MRN: [id number] Visit Date: 04/23/2022 PCP: No primary care provider on file.   Assessment & Plan:  Chief Complaint:  Chief Complaint  Patient presents with   Left Thumb - Follow-up, Wound Check   Visit Diagnoses:  1. Laceration of left thumb with infection, initial encounter   2. Wood splinter in thumb     Plan: Patient is 8 days s/p I&D of volar thumb infection w/ removal of a large wooden splinter.  She is doing well today.  Her thumb pain is improved.  She has improved ROM.  The incision is healing well by secondary intention.  She can follow up with me again as needed.   Follow-Up Instructions: No follow-ups on file.   Orders:  No orders of the defined types were placed in this encounter.  No orders of the defined types were placed in this encounter.   Imaging: No results found.  PMFS History: Patient Active Problem List   Diagnosis Date Noted   Wood splinter in thumb 04/15/2022   Laceration of left thumb with infection 04/13/2022   Past Medical History:  Diagnosis Date   Hypertension     Family History  Problem Relation Age of Onset   Hypertension Mother     No past surgical history on file. Social History   Occupational History   Not on file  Tobacco Use   Smoking status: Every Day    Packs/day: 0.50    Types: Cigarettes   Smokeless tobacco: Never  Vaping Use   Vaping Use: Never used  Substance and Sexual Activity   Alcohol use: Yes    Comment: socially   Drug use: Never   Sexual activity: Not Currently

## 2022-06-22 ENCOUNTER — Ambulatory Visit
Admission: EM | Admit: 2022-06-22 | Discharge: 2022-06-22 | Disposition: A | Discharge status: Home / Self Care | Payer: Commercial Managed Care - HMO | Attending: Physician Assistant | Admitting: Physician Assistant

## 2022-06-22 ENCOUNTER — Encounter: Payer: Self-pay | Admitting: Emergency Medicine

## 2022-06-22 ENCOUNTER — Other Ambulatory Visit: Payer: Self-pay

## 2022-06-22 DIAGNOSIS — J069 Acute upper respiratory infection, unspecified: Secondary | ICD-10-CM
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
## 2022-06-22 NOTE — ED Triage Notes (Signed)
Pt here for cough and congestion x 5 days 

## 2022-06-23 LAB — NOVEL CORONAVIRUS, NAA: SARS-CoV-2, NAA: NOT DETECTED

## 2023-10-05 ENCOUNTER — Other Ambulatory Visit: Payer: Self-pay

## 2023-10-05 ENCOUNTER — Ambulatory Visit
Admission: EM | Admit: 2023-10-05 | Discharge: 2023-10-05 | Disposition: A | Discharge status: Home / Self Care | Payer: No Typology Code available for payment source | Attending: Family Medicine | Admitting: Family Medicine

## 2023-10-05 ENCOUNTER — Encounter: Payer: Self-pay | Admitting: Emergency Medicine

## 2023-10-05 DIAGNOSIS — M25531 Pain in right wrist: Secondary | ICD-10-CM | POA: Diagnosis not present

## 2023-10-05 DIAGNOSIS — M79622 Pain in left upper arm: Secondary | ICD-10-CM | POA: Diagnosis not present

## 2023-10-05 DIAGNOSIS — Z23 Encounter for immunization: Secondary | ICD-10-CM | POA: Diagnosis not present

## 2023-10-05 DIAGNOSIS — W503XXA Accidental bite by another person, initial encounter: Secondary | ICD-10-CM

## 2023-10-05 MED ORDER — TETANUS-DIPHTH-ACELL PERTUSSIS 5-2.5-18.5 LF-MCG/0.5 IM SUSY
0.5000 mL | PREFILLED_SYRINGE | Freq: Once | INTRAMUSCULAR | Status: AC
Start: 2023-10-05 — End: 2023-10-05
  Administered 2023-10-05: 0.5 mL via INTRAMUSCULAR

## 2023-10-05 MED ORDER — MELOXICAM 15 MG PO TABS: 15.0000 mg | ORAL_TABLET | Freq: Every day | ORAL | 0 refills | Status: AC

## 2023-10-05 MED ORDER — DEXAMETHASONE SODIUM PHOSPHATE 10 MG/ML IJ SOLN
10.0000 mg | Freq: Once | INTRAMUSCULAR | Status: AC
  Administered 2023-10-05: 10 mg via INTRAMUSCULAR
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
